# Patient Record
Sex: Female | Born: 1980 | Race: Black or African American | Hispanic: No | Marital: Married | Smoking: Never smoker
Health system: Southern US, Community
[De-identification: ages and names within clinical notes are randomized; demographics above are authoritative.]

## PROBLEM LIST (undated history)

## (undated) DIAGNOSIS — T783XXA Angioneurotic edema, initial encounter: Secondary | ICD-10-CM

## (undated) DIAGNOSIS — L309 Dermatitis, unspecified: Secondary | ICD-10-CM

## (undated) DIAGNOSIS — O09529 Supervision of elderly multigravida, unspecified trimester: Secondary | ICD-10-CM

## (undated) DIAGNOSIS — O09299 Supervision of pregnancy with other poor reproductive or obstetric history, unspecified trimester: Secondary | ICD-10-CM

## (undated) HISTORY — DX: Dermatitis, unspecified: L30.9

## (undated) HISTORY — DX: Angioneurotic edema, initial encounter: T78.3XXA

## (undated) HISTORY — DX: Supervision of elderly multigravida, unspecified trimester: O09.529

## (undated) HISTORY — PX: NO PAST SURGERIES: SHX2092

## (undated) HISTORY — DX: Supervision of pregnancy with other poor reproductive or obstetric history, unspecified trimester: O09.299

---

## 2016-07-15 NOTE — L&D Delivery Note (Signed)
  Patient is 36 y.o. [name] 3567w1d admitted in spontaneous labor, hx of sickle cell trait and prior macrosomic birth.    Delivery Note At 3:38 AM a viable female was delivered via Vaginal, Spontaneous Delivery (Presentation: ROA; vertex ).  APGAR: 8,10; weight pending .   Placenta status: delivered spontaneously with gentle traction, in tact.  Cord: 3vc with the following complications: none.  Cord pH: not sent  Anesthesia:  none Episiotomy: None Lacerations: None Suture Repair: none Est. Blood Loss (mL): 100  Mom to postpartum.  Baby to Couplet care / Skin to Skin.  [name] 03/13/2017, 3:52 AM     Upon arrival patient was complete and pushing. She pushed with good maternal effort to deliver a healthy baby girl. Baby delivered without difficulty, was noted to have good tone and place on maternal abdomen for oral suctioning, drying and stimulation. Delayed cord clamping performed. Placenta delivered intact with 3V cord. Vaginal canal and perineum was inspected and in tact; hemostatic. Pitocin was started and uterus massaged until bleeding slowed. Counts of sharps, instruments, and lap pads were all correct.   Tillman SersAngela C Riccio, DO PGY-2 8/30/20183:54 AM  Patient is a A5W0981G3P2002 at 2367w1d who was admitted in active labor with SROM, significant hx of macrosomic birth (10lbs).  She progressed without augmentation other that AROM of a forebag right before she began pushing.  I was gloved and present for delivery in its entirety.  Second stage of labor progressed, baby delivered after approx 5 contractions.  No decels during second stage noted.  No difficulty with delivery of shoulders.  Complications: none  Lacerations: none  EBL: 100cc  SHAW, KIMBERLY, CNM 4:00 AM 03/13/2017

## 2016-09-17 DIAGNOSIS — Z3A13 13 weeks gestation of pregnancy: Secondary | ICD-10-CM | POA: Diagnosis not present

## 2016-09-17 DIAGNOSIS — Z3482 Encounter for supervision of other normal pregnancy, second trimester: Secondary | ICD-10-CM | POA: Diagnosis not present

## 2016-09-17 LAB — GLUCOSE TOLERANCE, 1 HOUR: Glucose, 1 Hour GTT: 90

## 2016-09-17 LAB — OB RESULTS CONSOLE ANTIBODY SCREEN: Antibody Screen: NEGATIVE

## 2016-09-17 LAB — OB RESULTS CONSOLE GC/CHLAMYDIA
CHLAMYDIA, DNA PROBE: NEGATIVE
Chlamydia: NEGATIVE
Gonorrhea: NEGATIVE
Gonorrhea: NEGATIVE

## 2016-09-17 LAB — OB RESULTS CONSOLE HIV ANTIBODY (ROUTINE TESTING)
HIV: NONREACTIVE
HIV: NONREACTIVE

## 2016-09-17 LAB — OB RESULTS CONSOLE HGB/HCT, BLOOD
HCT: 36
HEMATOCRIT: 36
HEMOGLOBIN: 12.3
Hemoglobin: 12.3

## 2016-09-17 LAB — OB RESULTS CONSOLE HEPATITIS B SURFACE ANTIGEN
HEP B S AG: NEGATIVE
HEP B S AG: NEGATIVE

## 2016-09-17 LAB — OB RESULTS CONSOLE PLATELET COUNT
Platelets: 163
Platelets: 163

## 2016-09-17 LAB — OB RESULTS CONSOLE RUBELLA ANTIBODY, IGM: RUBELLA: IMMUNE

## 2016-09-17 LAB — OB RESULTS CONSOLE ABO/RH: RH TYPE: POSITIVE

## 2016-09-17 LAB — URINE CULTURE
Pap: NEGATIVE
Urine Culture, OB: >100000

## 2016-09-17 LAB — OB RESULTS CONSOLE RPR
RPR: NONREACTIVE
RPR: NONREACTIVE

## 2016-09-17 LAB — SICKLE CELL SCREEN: SICKLE CELL SCREEN: POSITIVE

## 2016-12-17 LAB — OB RESULTS CONSOLE HGB/HCT, BLOOD
HCT: 36
HEMOGLOBIN: 12.1

## 2016-12-17 LAB — URINE CULTURE: URINE CULTURE, OB: NO GROWTH

## 2016-12-17 LAB — OB RESULTS CONSOLE PLATELET COUNT: Platelets: 129

## 2017-01-22 ENCOUNTER — Encounter: Payer: Self-pay | Admitting: *Deleted

## 2017-01-31 ENCOUNTER — Encounter: Payer: Self-pay | Admitting: *Deleted

## 2017-02-06 ENCOUNTER — Encounter: Payer: Self-pay | Admitting: *Deleted

## 2017-02-06 DIAGNOSIS — O2340 Unspecified infection of urinary tract in pregnancy, unspecified trimester: Secondary | ICD-10-CM | POA: Insufficient documentation

## 2017-02-10 ENCOUNTER — Encounter: Payer: Self-pay | Admitting: Obstetrics & Gynecology

## 2017-02-10 ENCOUNTER — Ambulatory Visit (INDEPENDENT_AMBULATORY_CARE_PROVIDER_SITE_OTHER): Payer: Medicaid Other | Admitting: Obstetrics & Gynecology

## 2017-02-10 DIAGNOSIS — Z3483 Encounter for supervision of other normal pregnancy, third trimester: Secondary | ICD-10-CM

## 2017-02-10 DIAGNOSIS — D573 Sickle-cell trait: Secondary | ICD-10-CM

## 2017-02-10 DIAGNOSIS — O09293 Supervision of pregnancy with other poor reproductive or obstetric history, third trimester: Secondary | ICD-10-CM

## 2017-02-10 DIAGNOSIS — O09299 Supervision of pregnancy with other poor reproductive or obstetric history, unspecified trimester: Secondary | ICD-10-CM

## 2017-02-10 DIAGNOSIS — Z3480 Encounter for supervision of other normal pregnancy, unspecified trimester: Secondary | ICD-10-CM | POA: Insufficient documentation

## 2017-02-10 DIAGNOSIS — Z87898 Personal history of other specified conditions: Secondary | ICD-10-CM

## 2017-02-10 HISTORY — DX: Supervision of pregnancy with other poor reproductive or obstetric history, unspecified trimester: O09.299

## 2017-02-10 LAB — POCT URINALYSIS DIP (DEVICE)
Bilirubin Urine: NEGATIVE
Glucose, UA: NEGATIVE mg/dL
Hgb urine dipstick: NEGATIVE
KETONES UR: NEGATIVE mg/dL
Leukocytes, UA: NEGATIVE
Nitrite: NEGATIVE
PROTEIN: NEGATIVE mg/dL
Specific Gravity, Urine: 1.015 (ref 1.005–1.030)
Urobilinogen, UA: 0.2 mg/dL (ref 0.0–1.0)
pH: 5.5 (ref 5.0–8.0)

## 2017-02-10 NOTE — Progress Notes (Signed)
   PRENATAL VISIT NOTE  Subjective:  Glenna DurandFatima Schwarzkopf is a 36 y.o. U9W1191G4P2012 at 3542w5d being seen today for ongoing prenatal care.  She is currently monitored for the following issues for this high-risk pregnancy and has UTI in pregnancy, antepartum; Supervision of other normal pregnancy, antepartum; Sickle cell trait (HCC); History of anesthesia complications; and History of macrosomia in infant in prior pregnancy, currently pregnant on her problem list.  Patient reports no complaints.  Contractions: Irregular. Vag. Bleeding: None.  Movement: Present. Denies leaking of fluid.   The following portions of the patient's history were reviewed and updated as appropriate: allergies, current medications, past family history, past medical history, past social history, past surgical history and problem list. Problem list updated.  Objective:   Vitals:   02/10/17 0815  BP: 118/68  Pulse: 87  Weight: 162 lb 12.8 oz (73.8 kg)    Fetal Status: Fetal Heart Rate (bpm): 135   Movement: Present     General:  Alert, oriented and cooperative. Patient is in no acute distress.  Skin: Skin is warm and dry. No rash noted.   Cardiovascular: Normal heart rate noted  Respiratory: Normal respiratory effort, no problems with respiration noted  Abdomen: Soft, gravid, appropriate for gestational age.  Pain/Pressure: Present     Pelvic: Cervical exam deferred        Extremities: Normal range of motion.  Edema: None  Mental Status:  Normal mood and affect. Normal behavior. Normal judgment and thought content.   Assessment and Plan:  Pregnancy: Y7W2956G4P2012 at 6742w5d  1. Supervision of other normal pregnancy, antepartum - Culture, OB Urine - [facility] and need glucola results (was told it was nml).  2. Sickle cell trait (HCC) - Culture, OB Urine  3. History of anesthesia complications Spianl HA after epidural.  Planning natural childbirth.  4. History of macrosomia in infant in prior pregnancy,  currently pregnant 10lb [place].  No shoulder dystocia per patient.  Term labor symptoms and general obstetric precautions including but not limited to vaginal bleeding, contractions, leaking of fluid and fetal movement were reviewed in detail with the patient. Please refer to After Visit Summary for other counseling recommendations.  Return in about 1 week (around 02/17/2017).   Elsie LincolnKelly Reyes Fifield, MD

## 2017-02-12 LAB — URINE CULTURE, OB REFLEX

## 2017-02-12 LAB — CULTURE, OB URINE

## 2017-02-17 ENCOUNTER — Other Ambulatory Visit (HOSPITAL_COMMUNITY)
Admission: RE | Admit: 2017-02-17 | Discharge: 2017-02-17 | Disposition: A | Discharge status: Home / Self Care | Payer: Medicaid Other | Source: Ambulatory Visit | Attending: Student | Admitting: Student

## 2017-02-17 ENCOUNTER — Ambulatory Visit (INDEPENDENT_AMBULATORY_CARE_PROVIDER_SITE_OTHER): Payer: Medicaid Other | Admitting: Student

## 2017-02-17 VITALS — BP 110/65 | HR 75 | Wt 165.8 lb

## 2017-02-17 DIAGNOSIS — Z3480 Encounter for supervision of other normal pregnancy, unspecified trimester: Secondary | ICD-10-CM | POA: Diagnosis not present

## 2017-02-17 DIAGNOSIS — IMO0002 Reserved for concepts with insufficient information to code with codable children: Secondary | ICD-10-CM

## 2017-02-17 DIAGNOSIS — O2340 Unspecified infection of urinary tract in pregnancy, unspecified trimester: Secondary | ICD-10-CM

## 2017-02-17 DIAGNOSIS — O2343 Unspecified infection of urinary tract in pregnancy, third trimester: Secondary | ICD-10-CM

## 2017-02-17 DIAGNOSIS — Z0489 Encounter for examination and observation for other specified reasons: Secondary | ICD-10-CM

## 2017-02-17 DIAGNOSIS — Z3483 Encounter for supervision of other normal pregnancy, third trimester: Secondary | ICD-10-CM

## 2017-02-17 DIAGNOSIS — Z048 Encounter for examination and observation for other specified reasons: Secondary | ICD-10-CM

## 2017-02-17 NOTE — Progress Notes (Signed)
OB US scheduled for August 13th @ 1345.  Pt notified.

## 2017-02-17 NOTE — Patient Instructions (Signed)

## 2017-02-18 NOTE — Progress Notes (Signed)
   PRENATAL VISIT NOTE  Subjective:  Melanie Peterson is a 36 y.o. [name] at 949w6d being seen today for ongoing prenatal care.  She is currently monitored for the following issues for this low-risk pregnancy and has UTI in pregnancy, antepartum; Supervision of other normal pregnancy, antepartum; Sickle cell trait (HCC); History of anesthesia complications; and History of macrosomia in infant in prior pregnancy, currently pregnant on her problem list.  Patient reports no complaints.  Contractions: Not present. Vag. Bleeding: None.  Movement: Present. Denies leaking of fluid.   The following portions of the patient's history were reviewed and updated as appropriate: allergies, current medications, past family history, past medical history, past social history, past surgical history and problem list. Problem list updated.  Objective:   Vitals:   02/17/17 1124  BP: 110/65  Pulse: 75  Weight: 165 lb 12.8 oz (75.2 kg)    Fetal Status: Fetal Heart Rate (bpm): 140 Fundal Height: 36 cm Movement: Present     General:  Alert, oriented and cooperative. Patient is in no acute distress.  Skin: Skin is warm and dry. No rash noted.   Cardiovascular: Normal heart rate noted  Respiratory: Normal respiratory effort, no problems with respiration noted  Abdomen: Soft, gravid, appropriate for gestational age.  Pain/Pressure: Present     Pelvic: Cervical exam deferred        Extremities: Normal range of motion.  Edema: None  Mental Status:  Normal mood and affect. Normal behavior. Normal judgment and thought content.   Assessment and Plan:  Pregnancy: A5W0981G4P2012 at 6049w6d  1. Supervision of other normal pregnancy, antepartum  - Culture, beta strep (group b only) - US MFM OB COMP + 14 WK; Future - GC/Chlamydia probe amp ([place])[facility]  2. Evaluate anatomy not seen on prior sonogram  - US MFM OB COMP + 14 WK; Future - GC/Chlamydia probe amp ([place])[facility]  3. UTI in pregnancy,  antepartum Patient denies any s/s of UTI at this time.   Term labor symptoms and general obstetric precautions including but not limited to vaginal bleeding, contractions, leaking of fluid and fetal movement were reviewed in detail with the patient. Please refer to After Visit Summary for other counseling recommendations.  Return in about 1 week (around 02/24/2017).   Marylene LandKathryn Lorraine Chailyn Racette, CNM

## 2017-02-19 LAB — GC/CHLAMYDIA PROBE AMP (~~LOC~~) NOT AT ARMC
Chlamydia: NEGATIVE
Neisseria Gonorrhea: NEGATIVE

## 2017-02-21 LAB — CULTURE, BETA STREP (GROUP B ONLY): Strep Gp B Culture: NEGATIVE

## 2017-02-24 ENCOUNTER — Other Ambulatory Visit: Payer: Self-pay | Admitting: Student

## 2017-02-24 ENCOUNTER — Ambulatory Visit (INDEPENDENT_AMBULATORY_CARE_PROVIDER_SITE_OTHER): Payer: Medicaid Other | Admitting: Advanced Practice Midwife

## 2017-02-24 ENCOUNTER — Ambulatory Visit (HOSPITAL_COMMUNITY)
Admission: RE | Admit: 2017-02-24 | Discharge: 2017-02-24 | Disposition: A | Discharge status: Home / Self Care | Payer: Medicaid Other | Source: Ambulatory Visit | Attending: Student | Admitting: Student

## 2017-02-24 VITALS — BP 112/66 | HR 89 | Wt 166.0 lb

## 2017-02-24 DIAGNOSIS — Z3480 Encounter for supervision of other normal pregnancy, unspecified trimester: Secondary | ICD-10-CM

## 2017-02-24 DIAGNOSIS — O09293 Supervision of pregnancy with other poor reproductive or obstetric history, third trimester: Secondary | ICD-10-CM | POA: Diagnosis not present

## 2017-02-24 DIAGNOSIS — Z3A36 36 weeks gestation of pregnancy: Secondary | ICD-10-CM

## 2017-02-24 DIAGNOSIS — Z3689 Encounter for other specified antenatal screening: Secondary | ICD-10-CM | POA: Diagnosis not present

## 2017-02-24 DIAGNOSIS — IMO0002 Reserved for concepts with insufficient information to code with codable children: Secondary | ICD-10-CM

## 2017-02-24 DIAGNOSIS — Z3483 Encounter for supervision of other normal pregnancy, third trimester: Secondary | ICD-10-CM

## 2017-02-24 DIAGNOSIS — O352XX0 Maternal care for (suspected) hereditary disease in fetus, not applicable or unspecified: Secondary | ICD-10-CM | POA: Insufficient documentation

## 2017-02-24 DIAGNOSIS — Z048 Encounter for examination and observation for other specified reasons: Secondary | ICD-10-CM | POA: Diagnosis present

## 2017-02-24 DIAGNOSIS — Z862 Personal history of diseases of the blood and blood-forming organs and certain disorders involving the immune mechanism: Secondary | ICD-10-CM

## 2017-02-24 DIAGNOSIS — Z0489 Encounter for examination and observation for other specified reasons: Secondary | ICD-10-CM

## 2017-02-24 NOTE — Progress Notes (Signed)
   PRENATAL VISIT NOTE  Subjective:  Melanie Peterson is a 36 y.o. [id number] at 4053w5d being seen today for ongoing prenatal care.  She is currently monitored for the following issues for this low-risk pregnancy and has UTI in pregnancy, antepartum; Supervision of other normal pregnancy, antepartum; Sickle cell trait (HCC); History of anesthesia complications; and History of macrosomia in infant in prior pregnancy, currently pregnant on her problem list.  Patient reports occasional contractions.  Contractions: Irregular. Vag. Bleeding: None.  Movement: Present. Denies leaking of fluid. States this baby feels smaller than her 10 lb baby.   The following portions of the patient's history were reviewed and updated as appropriate: allergies, current medications, past family history, past medical history, past social history, past surgical history and problem list. Problem list updated.  Objective:   Vitals:   02/24/17 1057  BP: 112/66  Pulse: 89  Weight: 166 lb (75.3 kg)    Fetal Status: Fetal Heart Rate (bpm): 150 Fundal Height: 38 cm Movement: Present  Presentation: Vertex  General:  Alert, oriented and cooperative. Patient is in no acute distress.  Skin: Skin is warm and dry. No rash noted.   Cardiovascular: Normal heart rate noted  Respiratory: Normal respiratory effort, no problems with respiration noted  Abdomen: Soft, gravid, appropriate for gestational age.  Pain/Pressure: Present     Pelvic: Cervical exam performed Dilation: 3 Effacement (%): 0 Station: -3  Extremities: Normal range of motion.  Edema: None  Mental Status:  Normal mood and affect. Normal behavior. Normal judgment and thought content.   GBS neg  Assessment and Plan:  Pregnancy: J4N8295G4P2012 at 6453w5d  1. Supervision of other normal pregnancy, antepartum   Term labor symptoms and general obstetric precautions including but not limited to vaginal bleeding, contractions, leaking of fluid and fetal movement were reviewed in  detail with the patient. Please refer to After Visit Summary for other counseling recommendations.  Return in about 1 week (around 03/03/2017) for ROB.   Dorathy KinsmanVirginia Zalyn Amend, CNM

## 2017-02-24 NOTE — Patient Instructions (Signed)
AREA PEDIATRIC/FAMILY PRACTICE PHYSICIANS  [facility] [address], [address] [place], [place]  [address] Phone - [phone]   Fax - [phone]  [facility] [address] [place], [place] [address] Phone - [phone]   Fax - [phone]  [facility] [address] [place], [place]  [address] Phone - [phone]   Fax - [phone]  [facility] [address] [place], [place]  [address] Phone - [phone]   Fax - [phone]  [facility] [address] [place], [place]  [address] Phone - [phone]   Fax - [phone]  [facility] [address] [place], [place]  [address] Phone - [phone]   Fax - [phone]  [facility] [place] [facility] [address] [place], [place]  [address] Phone - [phone]   Fax - [phone]  [facility] [address] [place], [place]  [address] Phone - [phone]   Fax - [phone]  [facility] [address] [place], [place]  [address] Phone - [phone]   Fax - [phone] [facility] [address] [place], [place]  [address] Phone - [phone]   Fax - [phone]  [facility] [address] [place], [place]  [address] Phone - [phone]   Fax - [phone]  [facility] [address] [place], [place]  [address] Phone - [phone]   Fax - [phone]  [facility] [address] [place], [place]  [address] Phone - [phone]   Fax - [phone]  [facility] [address] [place], [place]  [address] Phone - [phone]  [facility] [address] [place], [place]  [address] Phone - [phone]   Fax - [phone]  [facility] [address] [place], [place]  [address] Phone - [phone]   Fax - [phone]  [facility] [address] [place], [place]  [address] Phone - [phone]   Fax - [phone]  [facility] [address] [place], [place]  [address] Phone - [phone]   Fax - [phone]   [facility] [address] [place], [place]  [address] Phone - [phone]   Fax - [phone]  [facility] [address] [place], [place]  [address] Phone - [phone]   Fax - [phone]  [facility] [address] [place], [place]  [address] Phone - [phone]   Fax - [phone]  [facility] [address] , [place]  [address] Phone - [phone]   Fax - [phone]  [facility] [address] , [place]  [address] Phone - [phone]   Fax - [phone] [facility] [address] [place], [place]  [address] Phone - [phone]   Fax - [phone]  [facility] [address] [place], [place]  [address] Phone - [phone]   Fax - [phone]   [facility] [address] [place], [place]  [address] Phone - [phone]   Fax - [phone]  [facility] Charlene Flemming MD [address] [place] [address] Phone [phone]  Fax [phone]   Braxton Hicks Contractions Contractions of the uterus can occur throughout pregnancy, but they are   not always a sign that you are in labor. You may have practice contractions called Braxton Hicks contractions. These false labor contractions are sometimes confused with true labor. What are Braxton Hicks contractions? Braxton Hicks contractions are tightening movements that occur in the muscles of the uterus before labor. Unlike true labor contractions, these contractions do not result in  opening (dilation) and thinning of the cervix. Toward the end of pregnancy (32-34 weeks), Braxton Hicks contractions can happen more often and may become stronger. These contractions are sometimes difficult to tell apart from true labor because they can be very uncomfortable. You should not feel embarrassed if you go to the hospital with false labor. Sometimes, the only way to tell if you are in true labor is for your health care provider to look for changes in the cervix. The health care provider will do a physical exam and may monitor your contractions. If you are not in true labor, the exam should show that your cervix is not dilating and your water has not broken. If there are no prenatal problems or other health problems associated with your pregnancy, it is completely safe for you to be sent home with false labor. You may continue to have Braxton Hicks contractions until you go into true labor. How can I tell the difference between true labor and false labor?  Differences ? False labor ? Contractions last 30-70 seconds.: Contractions are usually shorter and not as strong as true labor contractions. ? Contractions become very regular.: Contractions are usually irregular. ? Discomfort is usually felt in the top of the uterus, and it spreads to the lower abdomen and low back.: Contractions are often felt in the front of the lower abdomen and in the groin. ? Contractions do not go away with walking.: Contractions may go away when you walk around or change positions while lying down. ? Contractions usually become more intense and increase in frequency.: Contractions get weaker and are shorter-lasting as time goes on. ? The cervix dilates and gets thinner.: The cervix usually does not dilate or become thin. Follow these instructions at home:  Take over-the-counter and prescription medicines only as told by your health care provider.  Keep up with your usual exercises and follow other instructions  from your health care provider.  Eat and drink lightly if you think you are going into labor.  If Braxton Hicks contractions are making you uncomfortable: ? Change your position from lying down or resting to walking, or change from walking to resting. ? Sit and rest in a tub of warm water. ? Drink enough fluid to keep your urine clear or pale yellow. Dehydration may cause these contractions. ? Do slow and deep breathing several times an hour.  Keep all follow-up prenatal visits as told by your health care provider. This is important. Contact a health care provider if:  You have a fever.  You have continuous pain in your abdomen. Get help right away if:  Your contractions become stronger, more regular, and closer together.  You have fluid leaking or gushing from your vagina.  You pass blood-tinged mucus (bloody show).  You have bleeding from your vagina.  You have low back pain that you never had before.  You feel your baby's head pushing down and causing pelvic pressure.  Your baby is not moving inside you as much as it used to. Summary  Contractions that occur before labor are called Braxton Hicks contractions, false labor, or practice contractions.  Braxton Hicks contractions   are usually shorter, weaker, farther apart, and less regular than true labor contractions. True labor contractions usually become progressively stronger and regular and they become more frequent.  Manage discomfort from Braxton Hicks contractions by changing position, resting in a warm bath, drinking plenty of water, or practicing deep breathing. This information is not intended to replace advice given to you by your health care provider. Make sure you discuss any questions you have with your health care provider. Document Released: 07/01/2005 Document Revised: 05/20/2016 Document Reviewed: 05/20/2016 Elsevier Interactive Patient Education  2017 Elsevier Inc.  

## 2017-03-03 ENCOUNTER — Encounter: Payer: Self-pay | Admitting: Family Medicine

## 2017-03-03 ENCOUNTER — Ambulatory Visit (INDEPENDENT_AMBULATORY_CARE_PROVIDER_SITE_OTHER): Payer: Medicaid Other | Admitting: Family Medicine

## 2017-03-03 VITALS — BP 117/61 | Wt 167.4 lb

## 2017-03-03 DIAGNOSIS — O09299 Supervision of pregnancy with other poor reproductive or obstetric history, unspecified trimester: Secondary | ICD-10-CM

## 2017-03-03 DIAGNOSIS — Z3483 Encounter for supervision of other normal pregnancy, third trimester: Secondary | ICD-10-CM

## 2017-03-03 DIAGNOSIS — Z3480 Encounter for supervision of other normal pregnancy, unspecified trimester: Secondary | ICD-10-CM

## 2017-03-03 DIAGNOSIS — O09293 Supervision of pregnancy with other poor reproductive or obstetric history, third trimester: Secondary | ICD-10-CM

## 2017-03-03 NOTE — Progress Notes (Signed)
   PRENATAL VISIT NOTE  Subjective:  Melanie Peterson is a 36 y.o. H4V4259 at [redacted]w[redacted]d being seen today for ongoing prenatal care.  She is currently monitored for the following issues for this low-risk pregnancy and has UTI in pregnancy, antepartum; Supervision of other normal pregnancy, antepartum; Sickle cell trait (HCC); History of anesthesia complications; and History of macrosomia in infant in prior pregnancy, currently pregnant on her problem list.  Patient reports no complaints.  Contractions: Not present. Vag. Bleeding: None.  Movement: Present. Denies leaking of fluid.   The following portions of the patient's history were reviewed and updated as appropriate: allergies, current medications, past family history, past medical history, past social history, past surgical history and problem list. Problem list updated.  Objective:   Vitals:   03/03/17 1037  BP: 117/61  Weight: 167 lb 6.4 oz (75.9 kg)    Fetal Status: Fetal Heart Rate (bpm): 145   Movement: Present  Presentation: Vertex  General:  Alert, oriented and cooperative. Patient is in no acute distress.  Skin: Skin is warm and dry. No rash noted.   Cardiovascular: Normal heart rate noted  Respiratory: Normal respiratory effort, no problems with respiration noted  Abdomen: Soft, gravid, appropriate for gestational age.  Pain/Pressure: Present     Pelvic: Cervical exam performed Dilation: 3      Extremities: Normal range of motion.  Edema: None  Mental Status:  Normal mood and affect. Normal behavior. Normal judgment and thought content.   Assessment and Plan:  Pregnancy: [id number] at [redacted]w[redacted]d  1. Supervision of other normal pregnancy, antepartum - Routine PNC - Follow up in 1 week  2. History of macrosomia in infant in prior pregnancy, currently pregnant Per history, 10-lb baby in 2012. No shoulder dystocia - EFW 3393g/7lb8oz, 89%ile on 02/24/17 at [redacted]w[redacted]d  Term labor symptoms and general obstetric precautions including but not  limited to vaginal bleeding, contractions, leaking of fluid and fetal movement were reviewed in detail with the patient. Please refer to After Visit Summary for other counseling recommendations.  Return in about 1 week (around 03/10/2017).   Frederik Pear, MD   Future Appointments Date Time Provider Department Center  03/10/2017 10:40 AM Kathlene Cote [facility]  03/18/2017 7:40 AM Dorathy Kinsman, CNM [facility]

## 2017-03-10 ENCOUNTER — Telehealth (HOSPITAL_COMMUNITY): Payer: Self-pay | Admitting: *Deleted

## 2017-03-10 ENCOUNTER — Other Ambulatory Visit: Payer: Self-pay | Admitting: Family Medicine

## 2017-03-10 ENCOUNTER — Encounter: Payer: Self-pay | Admitting: Medical

## 2017-03-10 ENCOUNTER — Ambulatory Visit (INDEPENDENT_AMBULATORY_CARE_PROVIDER_SITE_OTHER): Payer: Medicaid Other | Admitting: Medical

## 2017-03-10 VITALS — BP 113/69 | HR 78 | Wt 167.8 lb

## 2017-03-10 DIAGNOSIS — Z3480 Encounter for supervision of other normal pregnancy, unspecified trimester: Secondary | ICD-10-CM

## 2017-03-10 NOTE — Progress Notes (Signed)
   PRENATAL VISIT NOTE  Subjective:  Melanie Peterson is a 36 y.o. O7P0340 at [redacted]w[redacted]d being seen today for ongoing prenatal care.  She is currently monitored for the following issues for this high-risk pregnancy and has UTI in pregnancy, antepartum; Supervision of other normal pregnancy, antepartum; Sickle cell trait (HCC); History of anesthesia complications; and History of macrosomia in infant in prior pregnancy, currently pregnant on her problem list.  Patient reports occasional contractions.  Contractions: Irregular. Vag. Bleeding: None.  Movement: Present. Denies leaking of fluid.   The following portions of the patient's history were reviewed and updated as appropriate: allergies, current medications, past family history, past medical history, past social history, past surgical history and problem list. Problem list updated.  Objective:   Vitals:   03/10/17 1023  BP: 113/69  Pulse: 78  Weight: 167 lb 12.8 oz (76.1 kg)    Fetal Status: Fetal Heart Rate (bpm): 141 Fundal Height: 39 cm Movement: Present     General:  Alert, oriented and cooperative. Patient is in no acute distress.  Skin: Skin is warm and dry. No rash noted.   Cardiovascular: Normal heart rate noted  Respiratory: Normal respiratory effort, no problems with respiration noted  Abdomen: Soft, gravid, appropriate for gestational age.  Pain/Pressure: Absent     Pelvic: Cervical exam deferred        Extremities: Normal range of motion.  Edema: None  Mental Status:  Normal mood and affect. Normal behavior. Normal judgment and thought content.   Assessment and Plan:  Pregnancy: [id number] at [redacted]w[redacted]d  1. Supervision of other normal pregnancy, antepartum - History of macrosomia, patient EFW 89%til at 36 weeks - Discussed option to induce labor at 39 weeks, patient and husband would prefer to wait until due date - IOL scheduled for 03/19/17  Term labor symptoms and general obstetric precautions including but not limited to vaginal  bleeding, contractions, leaking of fluid and fetal movement were reviewed in detail with the patient. Please refer to After Visit Summary for other counseling recommendations.  Return in about 1 week (around 03/17/2017) for LOB.   Vonzella Nipple, PA-C

## 2017-03-10 NOTE — Progress Notes (Signed)
Patient here today for Routine OB visit [redacted]w[redacted]d.

## 2017-03-10 NOTE — Patient Instructions (Signed)
Fetal Movement Counts °Patient Name: ________________________________________________ Patient Due Date: ____________________ °What is a fetal movement count? °A fetal movement count is the number of times that you feel your baby move during a certain amount of time. This may also be called a fetal kick count. A fetal movement count is recommended for every pregnant woman. You may be asked to start counting fetal movements as early as week 28 of your pregnancy. °Pay attention to when your baby is most active. You may notice your baby's sleep and wake cycles. You may also notice things that make your baby move more. You should do a fetal movement count: °· When your baby is normally most active. °· At the same time each day. ° °A good time to count movements is while you are resting, after having something to eat and drink. °How do I count fetal movements? °1. Find a quiet, comfortable area. Sit, or lie down on your side. °2. Write down the date, the start time and stop time, and the number of movements that you felt between those two times. Take this information with you to your health care visits. °3. For 2 hours, count kicks, flutters, swishes, rolls, and jabs. You should feel at least 10 movements during 2 hours. °4. You may stop counting after you have felt 10 movements. °5. If you do not feel 10 movements in 2 hours, have something to eat and drink. Then, keep resting and counting for 1 hour. If you feel at least 4 movements during that hour, you may stop counting. °Contact a health care provider if: °· You feel fewer than 4 movements in 2 hours. °· Your baby is not moving like he or she usually does. °Date: ____________ Start time: ____________ Stop time: ____________ Movements: ____________ °Date: ____________ Start time: ____________ Stop time: ____________ Movements: ____________ °Date: ____________ Start time: ____________ Stop time: ____________ Movements: ____________ °Date: ____________ Start time:  ____________ Stop time: ____________ Movements: ____________ °Date: ____________ Start time: ____________ Stop time: ____________ Movements: ____________ °Date: ____________ Start time: ____________ Stop time: ____________ Movements: ____________ °Date: ____________ Start time: ____________ Stop time: ____________ Movements: ____________ °Date: ____________ Start time: ____________ Stop time: ____________ Movements: ____________ °Date: ____________ Start time: ____________ Stop time: ____________ Movements: ____________ °This information is not intended to replace advice given to you by your health care provider. Make sure you discuss any questions you have with your health care provider. °Document Released: 07/31/2006 Document Revised: 02/28/2016 Document Reviewed: 08/10/2015 °Elsevier Interactive Patient Education © 2018 [employer] °Braxton Hicks Contractions °Contractions of the uterus can occur throughout pregnancy, but they are not always a sign that you are in labor. You may have practice contractions called Braxton Hicks contractions. These false labor contractions are sometimes confused with true labor. °What are Braxton Hicks contractions? °Braxton Hicks contractions are tightening movements that occur in the muscles of the uterus before labor. Unlike true labor contractions, these contractions do not result in opening (dilation) and thinning of the cervix. Toward the end of pregnancy (32-34 weeks), Braxton Hicks contractions can happen more often and may become stronger. These contractions are sometimes difficult to tell apart from true labor because they can be very uncomfortable. You should not feel embarrassed if you go to the hospital with false labor. °Sometimes, the only way to tell if you are in true labor is for your health care provider to look for changes in the cervix. The health care provider will do a physical exam and may monitor your contractions. If   you are not in true labor, the exam  should show that your cervix is not dilating and your water has not broken. °If there are no prenatal problems or other health problems associated with your pregnancy, it is completely safe for you to be sent home with false labor. You may continue to have Braxton Hicks contractions until you go into true labor. °How can I tell the difference between true labor and false labor? °· Differences °? False labor °? Contractions last 30-70 seconds.: Contractions are usually shorter and not as strong as true labor contractions. °? Contractions become very regular.: Contractions are usually irregular. °? Discomfort is usually felt in the top of the uterus, and it spreads to the lower abdomen and low back.: Contractions are often felt in the front of the lower abdomen and in the groin. °? Contractions do not go away with walking.: Contractions may go away when you walk around or change positions while lying down. °? Contractions usually become more intense and increase in frequency.: Contractions get weaker and are shorter-lasting as time goes on. °? The cervix dilates and gets thinner.: The cervix usually does not dilate or become thin. °Follow these instructions at home: °· Take over-the-counter and prescription medicines only as told by your health care provider. °· Keep up with your usual exercises and follow other instructions from your health care provider. °· Eat and drink lightly if you think you are going into labor. °· If Braxton Hicks contractions are making you uncomfortable: °? Change your position from lying down or resting to walking, or change from walking to resting. °? Sit and rest in a tub of warm water. °? Drink enough fluid to keep your urine clear or pale yellow. Dehydration may cause these contractions. °? Do slow and deep breathing several times an hour. °· Keep all follow-up prenatal visits as told by your health care provider. This is important. °Contact a health care provider if: °· You have a  fever. °· You have continuous pain in your abdomen. °Get help right away if: °· Your contractions become stronger, more regular, and closer together. °· You have fluid leaking or gushing from your vagina. °· You pass blood-tinged mucus (bloody show). °· You have bleeding from your vagina. °· You have low back pain that you never had before. °· You feel your baby’s head pushing down and causing pelvic pressure. °· Your baby is not moving inside you as much as it used to. °Summary °· Contractions that occur before labor are called Braxton Hicks contractions, false labor, or practice contractions. °· Braxton Hicks contractions are usually shorter, weaker, farther apart, and less regular than true labor contractions. True labor contractions usually become progressively stronger and regular and they become more frequent. °· Manage discomfort from Braxton Hicks contractions by changing position, resting in a warm bath, drinking plenty of water, or practicing deep breathing. °This information is not intended to replace advice given to you by your health care provider. Make sure you discuss any questions you have with your health care provider. °Document Released: 07/01/2005 Document Revised: 05/20/2016 Document Reviewed: 05/20/2016 °Elsevier Interactive Patient Education © 2017 Else[employer] Inc. ° °

## 2017-03-10 NOTE — Telephone Encounter (Signed)
Preadmission screen  

## 2017-03-12 ENCOUNTER — Inpatient Hospital Stay (HOSPITAL_COMMUNITY)
Admission: AD | Admit: 2017-03-12 | Discharge: 2017-03-15 | DRG: 775 | Disposition: A | Discharge status: Home / Self Care | Payer: Medicaid Other | Source: Ambulatory Visit | Attending: Obstetrics and Gynecology | Admitting: Obstetrics and Gynecology

## 2017-03-12 ENCOUNTER — Encounter (HOSPITAL_COMMUNITY): Payer: Self-pay | Admitting: *Deleted

## 2017-03-12 DIAGNOSIS — D573 Sickle-cell trait: Secondary | ICD-10-CM | POA: Diagnosis present

## 2017-03-12 DIAGNOSIS — Z3A39 39 weeks gestation of pregnancy: Secondary | ICD-10-CM

## 2017-03-12 DIAGNOSIS — O9902 Anemia complicating childbirth: Principal | ICD-10-CM | POA: Diagnosis present

## 2017-03-12 LAB — OB RESULTS CONSOLE GBS: STREP GROUP B AG: NEGATIVE

## 2017-03-12 NOTE — MAU Note (Signed)
Pt presents to MAU with contractions that started 30 mins prior to arrival that are every 10 mins apart. Pt denies LOF or vaginal bleeding. Reports good fetal movement. States she was 3.5cm last week in the office.

## 2017-03-13 ENCOUNTER — Encounter (HOSPITAL_COMMUNITY): Payer: Self-pay

## 2017-03-13 DIAGNOSIS — Z3A39 39 weeks gestation of pregnancy: Secondary | ICD-10-CM

## 2017-03-13 DIAGNOSIS — O9902 Anemia complicating childbirth: Secondary | ICD-10-CM | POA: Diagnosis present

## 2017-03-13 DIAGNOSIS — Z3493 Encounter for supervision of normal pregnancy, unspecified, third trimester: Secondary | ICD-10-CM | POA: Diagnosis present

## 2017-03-13 DIAGNOSIS — D573 Sickle-cell trait: Secondary | ICD-10-CM | POA: Diagnosis present

## 2017-03-13 LAB — POCT FERN TEST: POCT Fern Test: POSITIVE

## 2017-03-13 LAB — CBC
HEMATOCRIT: 34.8 % — AB (ref 36.0–46.0)
HEMOGLOBIN: 11.7 g/dL — AB (ref 12.0–15.0)
MCH: 26.1 pg (ref 26.0–34.0)
MCHC: 33.6 g/dL (ref 30.0–36.0)
MCV: 77.5 fL — ABNORMAL LOW (ref 78.0–100.0)
Platelets: 132 10*3/uL — ABNORMAL LOW (ref 150–400)
RBC: 4.49 MIL/uL (ref 3.87–5.11)
RDW: 14.4 % (ref 11.5–15.5)
WBC: 7.3 10*3/uL (ref 4.0–10.5)

## 2017-03-13 LAB — TYPE AND SCREEN
ABO/RH(D): O POS
ANTIBODY SCREEN: NEGATIVE

## 2017-03-13 LAB — ABO/RH: ABO/RH(D): O POS

## 2017-03-13 LAB — RPR: RPR: NONREACTIVE

## 2017-03-13 MED ORDER — LACTATED RINGERS IV SOLN: 500.0000 mL | INTRAVENOUS | Status: DC | PRN

## 2017-03-13 MED ORDER — SENNOSIDES-DOCUSATE SODIUM 8.6-50 MG PO TABS
2.0000 | ORAL_TABLET | ORAL | Status: DC
  Administered 2017-03-14 (×2): 2 via ORAL
  Filled 2017-03-13 (×2): qty 2

## 2017-03-13 MED ORDER — SIMETHICONE 80 MG PO CHEW: 80.0000 mg | CHEWABLE_TABLET | ORAL | Status: DC | PRN

## 2017-03-13 MED ORDER — COCONUT OIL OIL
1.0000 "application " | TOPICAL_OIL | Status: DC | PRN
  Administered 2017-03-14: 1 via TOPICAL
  Filled 2017-03-13: qty 120

## 2017-03-13 MED ORDER — OXYCODONE-ACETAMINOPHEN 5-325 MG PO TABS: 1.0000 | ORAL_TABLET | ORAL | Status: DC | PRN

## 2017-03-13 MED ORDER — PHENYLEPHRINE 40 MCG/ML (10ML) SYRINGE FOR IV PUSH (FOR BLOOD PRESSURE SUPPORT)
80.0000 ug | PREFILLED_SYRINGE | INTRAVENOUS | Status: DC | PRN
  Filled 2017-03-13: qty 5

## 2017-03-13 MED ORDER — OXYTOCIN BOLUS FROM INFUSION
500.0000 mL | Freq: Once | INTRAVENOUS | Status: AC
  Administered 2017-03-13: 500 mL via INTRAVENOUS

## 2017-03-13 MED ORDER — OXYCODONE-ACETAMINOPHEN 5-325 MG PO TABS: 2.0000 | ORAL_TABLET | ORAL | Status: DC | PRN

## 2017-03-13 MED ORDER — DIPHENHYDRAMINE HCL 25 MG PO CAPS: 25.0000 mg | ORAL_CAPSULE | Freq: Four times a day (QID) | ORAL | Status: DC | PRN

## 2017-03-13 MED ORDER — ZOLPIDEM TARTRATE 5 MG PO TABS: 5.0000 mg | ORAL_TABLET | Freq: Every evening | ORAL | Status: DC | PRN

## 2017-03-13 MED ORDER — LIDOCAINE HCL (PF) 1 % IJ SOLN
INTRAMUSCULAR | Status: AC
  Filled 2017-03-13: qty 30

## 2017-03-13 MED ORDER — BENZOCAINE-MENTHOL 20-0.5 % EX AERO
1.0000 "application " | INHALATION_SPRAY | CUTANEOUS | Status: DC | PRN
  Administered 2017-03-13: 1 via TOPICAL
  Filled 2017-03-13: qty 56

## 2017-03-13 MED ORDER — WITCH HAZEL-GLYCERIN EX PADS: 1.0000 "application " | MEDICATED_PAD | CUTANEOUS | Status: DC | PRN

## 2017-03-13 MED ORDER — ACETAMINOPHEN 325 MG PO TABS
650.0000 mg | ORAL_TABLET | ORAL | Status: DC | PRN
  Filled 2017-03-13: qty 2

## 2017-03-13 MED ORDER — LIDOCAINE HCL (PF) 1 % IJ SOLN
30.0000 mL | INTRAMUSCULAR | Status: DC | PRN
  Filled 2017-03-13: qty 30

## 2017-03-13 MED ORDER — EPHEDRINE 5 MG/ML INJ
10.0000 mg | INTRAVENOUS | Status: DC | PRN
  Filled 2017-03-13: qty 2

## 2017-03-13 MED ORDER — FENTANYL CITRATE (PF) 100 MCG/2ML IJ SOLN
INTRAMUSCULAR | Status: AC
  Filled 2017-03-13: qty 2

## 2017-03-13 MED ORDER — TETANUS-DIPHTH-ACELL PERTUSSIS 5-2.5-18.5 LF-MCG/0.5 IM SUSP: 0.5000 mL | Freq: Once | INTRAMUSCULAR | Status: DC

## 2017-03-13 MED ORDER — LACTATED RINGERS IV SOLN
INTRAVENOUS | Status: DC
  Administered 2017-03-13: 01:00:00 via INTRAVENOUS

## 2017-03-13 MED ORDER — ONDANSETRON HCL 4 MG/2ML IJ SOLN: 4.0000 mg | INTRAMUSCULAR | Status: DC | PRN

## 2017-03-13 MED ORDER — FENTANYL CITRATE (PF) 100 MCG/2ML IJ SOLN
50.0000 ug | INTRAMUSCULAR | Status: DC | PRN
  Administered 2017-03-13: 50 ug via INTRAVENOUS

## 2017-03-13 MED ORDER — DIBUCAINE 1 % RE OINT: 1.0000 "application " | TOPICAL_OINTMENT | RECTAL | Status: DC | PRN

## 2017-03-13 MED ORDER — OXYTOCIN 40 UNITS IN LACTATED RINGERS INFUSION - SIMPLE MED
INTRAVENOUS | Status: AC
  Administered 2017-03-13: 500 mL via INTRAVENOUS
  Filled 2017-03-13: qty 1000

## 2017-03-13 MED ORDER — ACETAMINOPHEN 325 MG PO TABS
650.0000 mg | ORAL_TABLET | ORAL | Status: DC | PRN
  Administered 2017-03-13: 650 mg via ORAL

## 2017-03-13 MED ORDER — IBUPROFEN 600 MG PO TABS
600.0000 mg | ORAL_TABLET | Freq: Four times a day (QID) | ORAL | Status: DC
  Administered 2017-03-13 – 2017-03-15 (×9): 600 mg via ORAL
  Filled 2017-03-13 (×9): qty 1

## 2017-03-13 MED ORDER — FLEET ENEMA 7-19 GM/118ML RE ENEM: 1.0000 | ENEMA | RECTAL | Status: DC | PRN

## 2017-03-13 MED ORDER — ONDANSETRON HCL 4 MG/2ML IJ SOLN: 4.0000 mg | Freq: Four times a day (QID) | INTRAMUSCULAR | Status: DC | PRN

## 2017-03-13 MED ORDER — DIPHENHYDRAMINE HCL 50 MG/ML IJ SOLN: 12.5000 mg | INTRAMUSCULAR | Status: DC | PRN

## 2017-03-13 MED ORDER — OXYTOCIN 40 UNITS IN LACTATED RINGERS INFUSION - SIMPLE MED
2.5000 [IU]/h | INTRAVENOUS | Status: DC
  Administered 2017-03-13: 2.5 [IU]/h via INTRAVENOUS

## 2017-03-13 MED ORDER — LACTATED RINGERS IV SOLN: 500.0000 mL | Freq: Once | INTRAVENOUS | Status: DC

## 2017-03-13 MED ORDER — FENTANYL 2.5 MCG/ML BUPIVACAINE 1/10 % EPIDURAL INFUSION (WH - ANES)
14.0000 mL/h | INTRAMUSCULAR | Status: DC | PRN
Start: 2017-03-13 — End: 2017-03-14

## 2017-03-13 MED ORDER — ONDANSETRON HCL 4 MG PO TABS: 4.0000 mg | ORAL_TABLET | ORAL | Status: DC | PRN

## 2017-03-13 MED ORDER — PRENATAL MULTIVITAMIN CH
1.0000 | ORAL_TABLET | Freq: Every day | ORAL | Status: DC
  Administered 2017-03-14: 1 via ORAL
  Filled 2017-03-13: qty 1

## 2017-03-13 MED ORDER — SOD CITRATE-CITRIC ACID 500-334 MG/5ML PO SOLN: 30.0000 mL | ORAL | Status: DC | PRN

## 2017-03-13 NOTE — H&P (Signed)
LABOR AND DELIVERY ADMISSION HISTORY AND PHYSICAL NOTE  Melanie Peterson is a 36 y.o. female [id number] with IUP at 3027w1d by LMP presenting for SROM, active labor.   She reports positive fetal movement. She denies vaginal bleeding. She is uncomfortable with contractions, desires natural birth. Has had 2 previous vaginal deliveries without complication; 2nd baby weighed 10lbs.  Prenatal History/Complications:  Past Medical History: History reviewed. No pertinent past medical history.  Past Surgical History: History reviewed. No pertinent surgical history.  Obstetrical History: OB History    Gravida Para Term Preterm AB Living   4 2 2  0 1 2   SAB TAB Ectopic Multiple Live Births   1 0 0 0 2      Social History: Social History   Social History  . Marital status: Divorced    Spouse name: N/A  . Number of children: N/A  . Years of education: N/A   Social History Main Topics  . Smoking status: Never Smoker  . Smokeless tobacco: Never Used  . Alcohol use No  . Drug use: No  . Sexual activity: Yes   Other Topics Concern  . None   Social History Narrative  . None    Family History: History reviewed. No pertinent family history.  Allergies: No Known Allergies  Prescriptions Prior to Admission  Medication Sig Dispense Refill Last Dose  . Prenatal Vit-Fe Fumarate-FA (MULTIVITAMIN-PRENATAL) 27-0.8 MG TABS tablet Take 1 tablet by mouth daily at 12 noon.   Taking     Review of Systems   All systems reviewed and negative except as stated in HPI  Blood pressure 136/74, pulse 76, temperature 99.3 F (37.4 C), temperature source Oral, resp. rate 18, last menstrual period 06/12/2016, SpO2 100 %. General appearance: alert, cooperative and no distress Lungs: no respiratory distress Heart: regular rate Abdomen: soft, non-tender Extremities: No calf swelling or tenderness Presentation: cephalic by nurse exam Fetal monitoring: baseline 140, minimal variability, accels present  no decels Uterine activity: q2-3 minutes Dilation: 7 Effacement (%): 100 Station: -2 Exam by:: Cletis MediaK. Anderson RN   Prenatal labs: ABO, Rh: O/Positive/-- (03/06 0000) Antibody: Negative (03/06 0000) Rubella: immune RPR: Nonreactive, Nonreactive (03/06 0000)  HBsAg: Negative, Negative (03/06 0000)  HIV: Non-reactive, Non-reactive (03/06 0000)  GBS: Negative (08/29 0000)  Genetic screening:  None Anatomy US: normal  Prenatal Transfer Tool  Maternal Diabetes: No Genetic Screening: none Maternal Ultrasounds/Referrals: Normal Fetal Ultrasounds or other Referrals:  Referred to Materal Fetal Medicine  Maternal Substance Abuse:  No Significant Maternal Medications:  None Significant Maternal Lab Results: Lab values include: Other: sickle cell trait  No results found for this or any previous visit (from the past 24 hour(s)).  Patient Active Problem List   Diagnosis Date Noted  . Normal labor 03/13/2017  . Supervision of other normal pregnancy, antepartum 02/10/2017  . Sickle cell trait (HCC) 02/10/2017  . History of anesthesia complications 02/10/2017  . History of macrosomia in infant in prior pregnancy, currently pregnant 02/10/2017  . UTI in pregnancy, antepartum 02/06/2017    Assessment: Melanie DurandFatima Eads is a 36 y.o. [id number] at 1327w1d here for SROM at 0030  #Labor:progressing naturally #Pain: Natural birth #FWB: Cat 1 tracing #ID:  GBS negative #MOF: both #MOC: pills #Circ:  n/a  Prior hx of fetal macrosomia, US 8/14 shows EFW 89%  Tillman SersAngela C Riccio, DO PGY-2 8/30/20181:09 AM   CNM attestation:  I have seen and examined this patient; I agree with above documentation in the resident's note.   Peabody EnergyFatima  Weisgerber is a 36 y.o. [id number] here for SOL/SROM  PE: BP 134/74   Pulse 82   Temp 98.1 F (36.7 C) (Oral)   Resp 18   Ht 5\' 4"  (1.626 m)   Wt 75.8 kg (167 lb)   LMP 06/12/2016   SpO2 100%   BMI 28.67 kg/m   Resp: normal effort, no distress Abd: gravid  ROS,  labs, PMH reviewed  Plan: Admit to [facility] Expectant management Anticipate SVD  Cam Hai CNM 03/13/2017, 3:59 AM

## 2017-03-13 NOTE — Lactation Note (Signed)
This note was copied from a baby's chart. Lactation Consultation Note  Patient Name: Melanie Glenna DurandFatima Peterson ZOXWR'UToday's Date: 03/13/2017 Reason for consult: Initial assessment   Initial consult with mom of 6 hour old infant. Infant has BF all morning per mom. Infant was cueing to feed and mom had asked nurse for formula. Enc mom to BF infant STS 8-12 x in 24 hours at first feeding cues. Enc mom to massage/compress breast with feeding to assist with milk flow. Mom was able to hand express independently and milk very easily expressible. Enc mom to use hand expression or manual pump to obtain supplement for infant vs formula. Discussed supply and demand, normal progression of milk coming to volume and NB nutritional needs and feeding behavior. Enc parents to maintain feeding log for feedings.  Mom latched infant to the left breast in the side lying position, infant latched easily with flanged lips, rhythmic suckles and intermittent swallows.  Mom experiencing uterine cramping with feeding, notified nurse mom is asking for pain medication. Reassured mom this is a common occurrence and usually lasts a few days after birth. Infant still BF when LC left the room. Mom reports nipple pain with initial latch that improves with feeding.    BF Resources handout and LC Brochure given, mom informed of IP/OP Services, BF Support Groups and LC phone #. Enc mom to call out for assistance as needed. [facility] and does not have a pump at home. Mom reports no further questions/concerns at this time.    Maternal Data Formula Feeding for Exclusion: Yes Reason for exclusion: Mother's choice to formula and breast feed on admission Has patient been taught Hand Expression?: Yes Does the patient have breastfeeding experience prior to this delivery?: Yes  Feeding Feeding Type: Breast Fed Length of feed: 10 min (still BF when LC left room)  LATCH Score Latch: Grasps breast easily, tongue down, lips  flanged, rhythmical sucking.  Audible Swallowing: Spontaneous and intermittent  Type of Nipple: Everted at rest and after stimulation  Comfort (Breast/Nipple): Filling, red/small blisters or bruises, mild/mod discomfort  Hold (Positioning): No assistance needed to correctly position infant at breast.  LATCH Score: 9  Interventions Interventions: Breast feeding basics reviewed;Support pillows;Expressed milk;Hand express  Lactation Tools Discussed/Used WIC Program: No   Consult Status Consult Status: Follow-up Date: 03/14/17 Follow-up type: In-patient    Silas FloodSharon S Janiece Peterson 03/13/2017, 10:35 AM

## 2017-03-14 MED ORDER — IBUPROFEN 600 MG PO TABS: 600.0000 mg | ORAL_TABLET | Freq: Four times a day (QID) | ORAL | 0 refills | Status: DC | PRN

## 2017-03-14 NOTE — Plan of Care (Signed)
Problem: Activity: Goal: Will verbalize the importance of balancing activity with adequate rest periods Outcome: Completed/Met Date Met: 03/14/17 Patient eating, ambulating, and voiding independently without any issues Goal: Ability to tolerate increased activity will improve Outcome: Completed/Met Date Met: 03/14/17 Patient eating, ambulating, and voiding independently without any issues  Problem: Nutritional: Goal: Dietary intake will improve Outcome: Completed/Met Date Met: 03/14/17 Patient eating, ambulating, and voiding independently without any issues  Problem: Urinary Elimination: Goal: Ability to reestablish a normal urinary elimination pattern will improve Outcome: Completed/Met Date Met: 03/14/17 Patient eating, ambulating, and voiding independently without any issues

## 2017-03-14 NOTE — Progress Notes (Signed)
Post Partum Day 1 Subjective: no complaints, up ad lib, voiding, tolerating PO and + flatus  Objective: Blood pressure 138/75, pulse (!) 54, temperature 98.4 F (36.9 C), temperature source Oral, resp. rate 18, height 5\' 4"  (1.626 m), weight 75.8 kg (167 lb), last menstrual period 06/12/2016, SpO2 99 %.  Physical Exam:  General: alert and cooperative Lochia: appropriate Uterine Fundus: firm DVT Evaluation: No evidence of DVT seen on physical exam.   Recent Labs  03/13/17 0045  HGB 11.7*  HCT 34.8*    Assessment/Plan: Plan for discharge tomorrow and Contraception POP   LOS: 1 day   [name] 03/14/2017, 7:18 AM

## 2017-03-14 NOTE — Lactation Note (Signed)
This note was copied from a baby's chart. Lactation Consultation Note  Patient Name: Melanie Peterson ZOXWR'UToday's Date: 03/14/2017 Reason for consult: Follow-up assessment;Nipple pain/trauma;Term  Visited with Mom, baby 735 hrs old.  Mom complaining of sore nipples.  Mom's breasts filling, and nipples have some slight abrasion on tips.  Offered a latch assist.  Baby placed in football hold on right breast.  Basics on positioning and support.  Showed Mom how to sandwich breast to facilitate a deeper latch to breast.  Baby slipped off a deep latch a couple times.  Repositioned baby and Mom's hand placement on breast.  Baby was able to attain a deep latch.  Multiple swallows identified for Mom and FOB.  Demonstrated to FOB how to pull on chin to open baby's mouth wider on breast.  Mom denies any discomfort.  Uterine cramping felt by Mom. Demonstrated alternate breast compression to increase milk transfer.  Encouraged Mom to keep baby STS, and feed often on cue. Mom to call prn for assistance with latching. Coconut oil given to Mom with instructions on use.  Hand pump given and demonstrated use and cleaning.     Feeding Feeding Type: Breast Fed Length of feed: 10 min  LATCH Score Latch: Grasps breast easily, tongue down, lips flanged, rhythmical sucking.  Audible Swallowing: Spontaneous and intermittent  Type of Nipple: Everted at rest and after stimulation  Comfort (Breast/Nipple): Filling, red/small blisters or bruises, mild/mod discomfort (breasts filling, and nipples sore)  Hold (Positioning): Assistance needed to correctly position infant at breast and maintain latch.  LATCH Score: 8  Interventions Interventions: Breast feeding basics reviewed;Assisted with latch;Skin to skin;Breast massage;Hand express;Pre-pump if needed;Breast compression;Adjust position;Support pillows;Position options;Expressed milk;Coconut oil;Hand pump   Consult Status Consult Status: Follow-up Date:  03/15/17 Follow-up type: In-patient    Judee ClaraSmith, Billiejean Schimek E 03/14/2017, 3:12 PM

## 2017-03-14 NOTE — Discharge Summary (Signed)
OB Discharge Summary     Patient Name: Melanie Peterson DOB: 1980/12/03 MRN: [id number]  Date of admission: 03/12/2017 Delivering MD: Julieanne Cotton T   Date of discharge: 03/14/2017  Admitting diagnosis: 39WKS CTX Intrauterine pregnancy: [redacted]w[redacted]d     Secondary diagnosis:  Active Problems:   Normal labor  Additional problems: hx macrosomic infant (10lbs); GBS neg     Discharge diagnosis: Term Pregnancy Delivered                                                                                                Post partum procedures:none  Augmentation: none  Complications: None  Hospital course:  Onset of Labor With Vaginal Delivery     36 y.o. yo [id number] at [redacted]w[redacted]d was admitted in Active Labor on 03/12/2017. Patient had an uncomplicated labor course as follows:  Membrane Rupture Time/Date: 12:25 AM ,03/13/2017   Intrapartum Procedures: Episiotomy: None [1]                                         Lacerations:  None [1]  Patient had a delivery of a Viable infant. 03/13/2017  Information for the patient's newborn:  Kayelyn, Lemon Girl Juliona [[id number]]  Delivery Method: Vaginal, Spontaneous Delivery (Filed from Delivery Summary)    Pateint had an uncomplicated postpartum course.  She is ambulating, tolerating a regular diet, passing flatus, and urinating well. Patient is discharged home in stable condition on 03/14/17. She desired early discharge as long as baby would be cleared by peds for d/c.   Physical exam  Vitals:   03/13/17 0600 03/13/17 0939 03/13/17 1704 03/14/17 0551  BP: (!) 120/58 120/69 (!) 110/58 138/75  Pulse: (!) 53 66 70 (!) 54  Resp: 18 16 18 18   Temp: 99.4 F (37.4 C) 98.2 F (36.8 C) 98.6 F (37 C) 98.4 F (36.9 C)  TempSrc: Oral Oral Oral Oral  SpO2:  99%    Weight:      Height:       General: alert and cooperative Lochia: appropriate Uterine Fundus: firm Incision: N/A DVT Evaluation: No evidence of DVT seen on physical exam. Labs: Lab Results   Component Value Date   WBC 7.3 03/13/2017   HGB 11.7 (L) 03/13/2017   HCT 34.8 (L) 03/13/2017   MCV 77.5 (L) 03/13/2017   PLT 132 (L) 03/13/2017   No flowsheet data found.  Discharge instruction: per After Visit Summary and "Baby and Me Booklet".  After visit meds:  Allergies as of 03/14/2017   No Known Allergies     Medication List    TAKE these medications   ibuprofen 600 MG tablet Commonly known as:  ADVIL,MOTRIN Take 1 tablet (600 mg total) by mouth every 6 (six) hours as needed.   multivitamin-prenatal 27-0.8 MG Tabs tablet Take 1 tablet by mouth daily at 12 noon.            Discharge Care Instructions        Start     Ordered  03/14/17 0000  ibuprofen (ADVIL,MOTRIN) 600 MG tablet  Every 6 hours PRN    Question:  Supervising Provider  Answer:  Lesly DukesLEGGETT, KELLY H   03/14/17 [id number]   03/14/17 0000  Discharge patient    Question Answer Comment  Discharge disposition 01-Home or Self Care   Discharge patient date 03/14/2017      03/14/17 0906   03/12/17 0000  OB RESULT CONSOLE Group B Strep    Comments:  This external order was created through the Results Console.   03/12/17 2324      Diet: routine diet  Activity: Advance as tolerated. Pelvic rest for 6 weeks.   Outpatient follow up:4 weeks Follow up Appt: not sched yet Follow up Visit:No Follow-up on file.  Postpartum contraception: Progesterone only pills  Newborn Data: Live born female  Birth Weight: 8 lb 3.2 oz (3719 g) APGAR: 9, 10  Baby Feeding: Breast Disposition:home with mother   03/14/2017 Cam HaiSHAW, Zealand Boyett, CNM  11:18 AM

## 2017-03-14 NOTE — Discharge Instructions (Signed)

## 2017-03-15 NOTE — Lactation Note (Signed)
This note was copied from a baby's chart. Lactation Consultation Note  Patient Name: Girl Glenna DurandFatima Hinnenkamp ZOXWR'UToday's Date: 03/15/2017 Reason for consult: Follow-up assessment  Baby 53 hours old. Mom reports that she nursed first 2 children 18 and 12 months respectively. Mom reports that she intends to offer breast as she did with first 2 children. Mom denies any nipple pain. Offered to assist with latch, but mom declined. Enc to call for assistance as needed.   Maternal Data    Feeding    LATCH Score                   Interventions    Lactation Tools Discussed/Used     Consult Status Consult Status: PRN    Sherlyn HayJennifer D Jessikah Dicker 03/15/2017, 9:31 AM

## 2017-03-15 NOTE — Discharge Summary (Signed)
OB Discharge Summary                           Patient Name: Melanie Peterson DOB: [birth year] MRN: [id number]  Date of admission: 03/12/2017 Delivering MD: Julieanne Cotton T   Date of discharge: 03/14/2017  Admitting diagnosis: 39WKS CTX Intrauterine pregnancy: [redacted]w[redacted]d     Secondary diagnosis:  Active Problems:   Normal labor  Additional problems: hx macrosomic infant (10lbs); GBS neg                                      Discharge diagnosis: Term Pregnancy Delivered                                                                                                Post partum procedures:none  Augmentation: none  Complications: None  Hospital course:  Onset of Labor With Vaginal Delivery     36 y.o. yo [id number] at [redacted]w[redacted]d was admitted in Active Labor on 03/12/2017. Patient had an uncomplicated labor course as follows:  Membrane Rupture Time/Date: 12:25 AM ,03/13/2017   Intrapartum Procedures: Episiotomy: None [1]                                         Lacerations:  None [1]  Patient had a delivery of a Viable infant. 03/13/2017  Information for the patient's newborn:  Melanie Peterson, Melanie Peterson Girl Melanie Peterson [[id number]]  Delivery Method: Vaginal, Spontaneous Delivery (Filed from Delivery Summary)    Pateint had an uncomplicated postpartum course.  She is ambulating, tolerating a regular diet, passing flatus, and urinating well. Patient is discharged home in stable condition on 03/15/17. Pt had expressed interest in being discharged yesterday, but the Melanie was not released so she stayed an additional night.   Physical exam  Vitals:   03/14/17 1700 03/15/17 0540  BP: 112/62 116/70  Pulse: 61 (!) 53  Resp: 18 18  Temp: 98.3 F (36.8 C) 98.2 F (36.8 C)  SpO2: 100%    General: alert and cooperative Lochia: appropriate Uterine Fundus: firm Incision: N/A DV Evaluation: No evidence of DVT seen on physical exam. Labs: Recent Labs       Lab Results  Component Value Date   WBC 7.3  03/13/2017   HGB 11.7 (L) 03/13/2017   HCT 34.8 (L) 03/13/2017   MCV 77.5 (L) 03/13/2017   PLT 132 (L) 03/13/2017     No flowsheet data found.  Discharge instruction: per After Visit Summary and "Melanie Peterson Booklet".  After visit meds:  Allergies as of 03/14/2017   No Known Allergies                          Medication List               TAKE these medications  ibuprofen 600 MG tablet Commonly known as:  ADVIL,MOTRIN Take 1 tablet (600 mg total) by mouth every 6 (six) hours as needed.    multivitamin-prenatal 27-0.8 MG Tabs tablet Take 1 tablet by mouth daily at 12 noon.                                         Discharge Care Instructions               Start     Ordered   03/14/17 0000  ibuprofen (ADVIL,MOTRIN) 600 MG tablet  Every 6 hours PRN    Question:  Supervising Provider  Answer:  Lesly DukesLEGGETT, KELLY H   03/14/17 [id number]   03/14/17 0000  Discharge patient    Question Answer Comment  Discharge disposition 01-Home or Self Care   Discharge patient date 03/14/2017      03/14/17 0906   03/12/17 0000  OB RESULT CONSOLE Group B Strep    Comments:  This external order was created through the Results Console.   03/12/17 2324      Diet: routine diet  Activity: Advance as tolerated. Pelvic rest for 6 weeks.   Outpatient follow up:4 weeks Follow up Appt: not sched yet Follow up Visit:No Follow-up on file.  Postpartum contraception: Progesterone only pills  Newborn Data: Live born female  Birth Weight: 8 lb 3.2 oz (3719 g) APGAR: 9, 10  Melanie Feeding: Breast Disposition:home with mother  Cam HaiSHAW, [name] [facility]NM 03/15/2017 7:16 AM

## 2017-03-18 ENCOUNTER — Encounter: Payer: Medicaid Other | Admitting: Advanced Practice Midwife

## 2017-03-19 ENCOUNTER — Inpatient Hospital Stay (HOSPITAL_COMMUNITY): Admission: RE | Admit: 2017-03-19 | Payer: Medicaid Other | Source: Ambulatory Visit

## 2017-04-24 ENCOUNTER — Encounter: Payer: Self-pay | Admitting: Student

## 2017-04-24 ENCOUNTER — Ambulatory Visit: Payer: Self-pay | Admitting: Student

## 2017-04-30 ENCOUNTER — Encounter: Payer: Self-pay | Admitting: Student

## 2017-04-30 ENCOUNTER — Ambulatory Visit (INDEPENDENT_AMBULATORY_CARE_PROVIDER_SITE_OTHER): Payer: Medicaid Other | Admitting: Student

## 2017-04-30 VITALS — BP 121/75 | HR 69 | Wt 151.4 lb

## 2017-04-30 DIAGNOSIS — Z3042 Encounter for surveillance of injectable contraceptive: Secondary | ICD-10-CM | POA: Diagnosis not present

## 2017-04-30 DIAGNOSIS — Z3480 Encounter for supervision of other normal pregnancy, unspecified trimester: Secondary | ICD-10-CM

## 2017-04-30 DIAGNOSIS — Z3202 Encounter for pregnancy test, result negative: Secondary | ICD-10-CM | POA: Diagnosis not present

## 2017-04-30 DIAGNOSIS — Z1389 Encounter for screening for other disorder: Secondary | ICD-10-CM

## 2017-04-30 DIAGNOSIS — Z7251 High risk heterosexual behavior: Secondary | ICD-10-CM

## 2017-04-30 LAB — POCT PREGNANCY, URINE: PREG TEST UR: NEGATIVE

## 2017-04-30 MED ORDER — MEDROXYPROGESTERONE ACETATE 150 MG/ML IM SUSP
150.0000 mg | Freq: Once | INTRAMUSCULAR | Status: AC
  Administered 2017-04-30: 150 mg via INTRAMUSCULAR

## 2017-04-30 MED ORDER — MEDROXYPROGESTERONE ACETATE 150 MG/ML IM SUSP: 150.0000 mg | Freq: Once | INTRAMUSCULAR | Status: DC

## 2017-04-30 NOTE — Addendum Note (Signed)
Addended by: Lorelle GibbsWILSON, CHIQUITA L on: 04/30/2017 04:29 PM   Modules accepted: Orders

## 2017-04-30 NOTE — Progress Notes (Signed)
Subjective:     Melanie Peterson is a 36 y.o. female who presents for a postpartum visit. She is 6 weeks postpartum following a spontaneous vaginal delivery. I have fully reviewed the prenatal and intrapartum course. The delivery was at 39 gestational weeks. Outcome: spontaneous vaginal delivery. Anesthesia: none. Postpartum course has been uneventful. Baby's course has been uneventful. Baby is feeding by both breast and bottle - Similac Advance. Bleeding no bleeding. Bowel function is normal. Bladder function is normal. Patient is sexually active. Contraception method is none. Postpartum depression screening: negative.  Patient does not desire to be pregnant at this time. However, her husband is against birth control because he feels it is unsafe for her. Her husband also agrees that another pregnancy in quick succession would be unsafe for her as well.   The following portions of the patient's history were reviewed and updated as appropriate: allergies, current medications, past family history, past medical history, past social history, past surgical history and problem list.  Patient is feeling Review of Systems Pertinent items are noted in HPI.   Objective:    BP 121/75   Pulse 69   Wt 151 lb 6.4 oz (68.7 kg)   Breastfeeding? Yes   BMI 25.99 kg/m   General:  alert, cooperative and no distress   Breasts:  inspection negative, no nipple discharge or bleeding, no masses or nodularity palpable  Lungs: clear to auscultation bilaterally  Heart:  regular rate and rhythm, S1, S2 normal, no murmur, click, rub or gallop  Abdomen: soft, non-tender; bowel sounds normal; no masses,  no organomegaly   Vulva:  not evaluated  Vagina: not evaluated  Cervix:  not evaluated  Corpus: not examined  Adnexa:  not evaluated  Rectal Exam: Not performed.        Assessment:     Normal postpartum exam. Pap smear not done at today's visit.   Plan:    1. Contraception: Depo-Provera injections. Pregnancy  test is negative today; plan to return for Depo in 3 months.  2. Patient to take pregnancy test in two weeks. Counseled patient that if she is pregnant Depo will not harm pregnancy but she may not realize she is pregnant. Patient agrees to take pregnancy test in two weeks.  3. Follow up in: 3 months or as needed.   4. Pap in two years.  Luna KitchensKathryn Ofilia Peterson

## 2017-05-01 LAB — PREGNANCY, URINE: Preg Test, Ur: NEGATIVE

## 2017-07-17 ENCOUNTER — Encounter: Payer: Self-pay | Admitting: Student

## 2017-07-17 ENCOUNTER — Ambulatory Visit (INDEPENDENT_AMBULATORY_CARE_PROVIDER_SITE_OTHER): Payer: Medicaid Other | Admitting: Student

## 2017-07-17 VITALS — BP 112/69 | HR 64 | Ht 64.0 in | Wt 150.0 lb

## 2017-07-17 DIAGNOSIS — N939 Abnormal uterine and vaginal bleeding, unspecified: Secondary | ICD-10-CM

## 2017-07-17 MED ORDER — LEVONORGEST-ETH EST & ETH EST 42-21-21-7 DAYS PO TABS: 1.0000 | ORAL_TABLET | Freq: Every day | ORAL | 3 refills | Status: DC

## 2017-07-17 MED ORDER — MEDROXYPROGESTERONE ACETATE 10 MG PO TABS: 10.0000 mg | ORAL_TABLET | Freq: Every day | ORAL | 0 refills | Status: DC

## 2017-07-17 MED ORDER — NORGESTIMATE-ETH ESTRADIOL 0.25-35 MG-MCG PO TABS
1.0000 | ORAL_TABLET | Freq: Every day | ORAL | 11 refills | Status: DC
Start: 2017-07-17 — End: 2017-12-31

## 2017-07-17 NOTE — Patient Instructions (Addendum)
-  take PROVERA (medroxyprogesterone) 10 mg for 5 days. Take one pill a day. If bleeding hasn't stopped, take for another 5 days.   -Start taking birth control called Ortho-cyclin.   -DO NOT take levonogesteral pill  -Come back in two months if unhappy with bleeding.

## 2017-07-17 NOTE — Progress Notes (Signed)
Subjective:     Patient ID: Melanie DurandFatima Heathcock, female   DOB: 07-22-1980, 37 y.o.   MRN: [id number]  HPI Patient Melanie Peterson is a 37 y.o. [id number] here who wants to discuss a different method of birth control. She has had on Depo shot in October, but states that she is still bleeding. She says that the bleeding is not getting better. Some days it is worse and some days it is light, but overall it is not better. She wants to change methods but she does not know what kind of method she wants. She is also asking for medication to stop the bleeding. Her situation is complicated by the fact that as a Muslim, she cannot pray while she is bleeding. Her husband does not want her to have any more children, but he also does not want her to be on birth control. She is very confused about what to do.  She would like to have another child in two years; plans to start trying again when he baby born in August 2018 is 773 year old (about 8 months from now).  Review of Systems  Constitutional: Negative.   HENT: Negative.   Respiratory: Negative.   Cardiovascular: Negative.   Gastrointestinal: Negative.   Genitourinary: Positive for vaginal bleeding.  Musculoskeletal: Negative.   Neurological: Negative.   Psychiatric/Behavioral: Negative.        Objective:   Physical Exam  Constitutional: She is oriented to person, place, and time. She appears well-developed.  HENT:  Head: Normocephalic.  Neck: Normal range of motion.  Pulmonary/Chest: Effort normal.  Abdominal: Soft.  Genitourinary: Vagina normal.  Musculoskeletal: Normal range of motion.  Neurological: She is alert and oriented to person, place, and time.  Skin: Skin is warm and dry.  Psychiatric: She has a normal mood and affect.       Assessment:     After much discussion, patient would like to try Provera for 5 days and then switch to Sprintec. She understands that her bleeding may not resolve right away, and agrees to try this regimen for at  least 2 months. She will come back if she wants to discuss changing her birth control again.     Plan:     1. Abnormal uterine bleeding    2. Patient to take Provera for 5 days, and then start Sprintec. Advised not to have intercourse until she has been on Sprintec for at least 7 days.   3. All questions answered; patient and provider reviewed my written instructions together.  Luna KitchensKathryn Kooistra CNM

## 2017-10-04 ENCOUNTER — Emergency Department (HOSPITAL_COMMUNITY)
Admission: EM | Admit: 2017-10-04 | Discharge: 2017-10-04 | Disposition: A | Discharge status: Home / Self Care | Payer: Medicaid Other | Attending: Emergency Medicine | Admitting: Emergency Medicine

## 2017-10-04 ENCOUNTER — Encounter (HOSPITAL_COMMUNITY): Payer: Self-pay | Admitting: *Deleted

## 2017-10-04 DIAGNOSIS — Z3202 Encounter for pregnancy test, result negative: Secondary | ICD-10-CM

## 2017-10-04 DIAGNOSIS — Z79899 Other long term (current) drug therapy: Secondary | ICD-10-CM | POA: Insufficient documentation

## 2017-10-04 LAB — URINALYSIS, ROUTINE W REFLEX MICROSCOPIC
Bilirubin Urine: NEGATIVE
GLUCOSE, UA: NEGATIVE mg/dL
Hgb urine dipstick: NEGATIVE
Ketones, ur: NEGATIVE mg/dL
LEUKOCYTES UA: NEGATIVE
Nitrite: NEGATIVE
Protein, ur: NEGATIVE mg/dL
Specific Gravity, Urine: 1.016 (ref 1.005–1.030)
pH: 5 (ref 5.0–8.0)

## 2017-10-04 LAB — POC URINE PREG, ED: PREG TEST UR: NEGATIVE

## 2017-10-04 NOTE — Discharge Instructions (Addendum)
Your pregnancy test was negative today. Your urinalysis did not show evidence of UTI. Your offered further evaluation of your vaginal discharge but deferred at this time. Please follow with your primary care provider or OBGYN. If you develop worsening or new concerning symptoms you can return to the emergency department for re-evaluation.

## 2017-10-04 NOTE — ED Provider Notes (Signed)
[facility]-EMERGENCY DEPT Provider Note   CSN: [id number] Arrival date & time: 10/04/17  1304     History   Chief Complaint Chief Complaint  Patient presents with  . Generalized Body Aches    HPI Melanie DurandFatima Peterson is a 37 y.o. female who presents the emergency department today for concerns she may be pregnant.  Patient states that she is concerned she may be pregnant as she recently became sexually active and has not been using barrier protection.  She is sexually active with one female partner.  She is currently on birth control pills but does not take this regularly.  She notes that she has clear/white vaginal discharge over the last 1 week.  She reports that this morning when she awoke she was nauseous and had one episode of emesis.  She says is similar to the morning sickness that she had when she was pregnant.  She would like a pregnancy test to ensure she is not pregnant.  There is no associated abdominal pain with this.  The patient denies any URI symptoms, fever, chills, body aches, abdominal pain, diarrhea.  She does state she has been urinating more often but denies any dysuria, hematuria, urgency, or foul odor of her urine.  No flank pain.  She has taken Tylenol for her symptoms without relief.  The patient denies any pelvic pain, vaginal bleeding, vaginal itching, ulcers/lesions.  HPI  History reviewed. No pertinent past medical history.  Patient Active Problem List   Diagnosis Date Noted  . Abnormal uterine bleeding 07/17/2017  . Normal labor 03/13/2017  . Sickle cell trait (HCC) 02/10/2017  . History of anesthesia complications 02/10/2017  . History of macrosomia in infant in prior pregnancy, currently pregnant 02/10/2017    History reviewed. No pertinent surgical history.   OB History    Gravida  4   Para  2   Term  2   Preterm  0   AB  1   Living  2     SAB  1   TAB  0   Ectopic  0   Multiple  0   Live Births  2             Home Medications    Prior to Admission medications   Medication Sig Start Date End Date Taking? Authorizing Provider  ibuprofen (ADVIL,MOTRIN) 600 MG tablet Take 1 tablet (600 mg total) by mouth every 6 (six) hours as needed. Patient not taking: Reported on 04/30/2017 03/14/17   Arabella MerlesShaw, Kimberly D, CNM  medroxyPROGESTERone (PROVERA) 10 MG tablet Take 1 tablet (10 mg total) by mouth daily. 07/17/17   Marylene LandKooistra, Kathryn Lorraine, CNM  norgestimate-ethinyl estradiol (ORTHO-CYCLEN,SPRINTEC,PREVIFEM) 0.25-35 MG-MCG tablet Take 1 tablet by mouth daily. 07/17/17   Marylene LandKooistra, Kathryn Lorraine, CNM  Prenatal Vit-Fe Fumarate-FA (MULTIVITAMIN-PRENATAL) 27-0.8 MG TABS tablet Take 1 tablet by mouth daily at 12 noon.    [provider]    Family History No family history on file.  Social History Social History   Tobacco Use  . Smoking status: Never Smoker  . Smokeless tobacco: Never Used  Substance Use Topics  . Alcohol use: No  . Drug use: No     Allergies   Patient has no known allergies.   Review of Systems Review of Systems  All other systems reviewed and are negative.    Physical Exam Updated Vital Signs BP 118/67 (BP Location: Left Arm)   Pulse 97   Temp 98.9 F (37.2 C) (Oral)  Resp 18   LMP 09/13/2017   SpO2 98%   Physical Exam  Constitutional: She appears well-developed and well-nourished.  HENT:  Head: Normocephalic and atraumatic.  Right Ear: Tympanic membrane and external ear normal.  Left Ear: Tympanic membrane and external ear normal.  Nose: Nose normal.  Mouth/Throat: Uvula is midline, oropharynx is clear and moist and mucous membranes are normal. No tonsillar exudate.  Eyes: Pupils are equal, round, and reactive to light. Conjunctivae are normal. Right eye exhibits no discharge. Left eye exhibits no discharge. No scleral icterus.  Neck: Trachea normal. Neck supple. No spinous process tenderness present. No neck rigidity. Normal range of motion  present.  Cardiovascular: Normal rate, regular rhythm and intact distal pulses.  No murmur heard. Pulses:      Radial pulses are 2+ on the right side, and 2+ on the left side.       Dorsalis pedis pulses are 2+ on the right side, and 2+ on the left side.       Posterior tibial pulses are 2+ on the right side, and 2+ on the left side.  No lower extremity swelling or edema. Calves symmetric in size bilaterally.  Pulmonary/Chest: Effort normal and breath sounds normal. No respiratory distress. She exhibits no tenderness.  Abdominal: Soft. Bowel sounds are normal. She exhibits no distension. There is no tenderness. There is no rigidity, no rebound, no guarding and no CVA tenderness.  Musculoskeletal: She exhibits no edema.  Lymphadenopathy:    She has no cervical adenopathy.  Neurological: She is alert.  Skin: Skin is warm and dry. No rash noted. She is not diaphoretic. No pallor.  Psychiatric: She has a normal mood and affect.  Nursing note and vitals reviewed.    ED Treatments / Results  Labs (all labs ordered are listed, but only abnormal results are displayed) Labs Reviewed  URINALYSIS, ROUTINE W REFLEX MICROSCOPIC  POC URINE PREG, ED    EKG None  Radiology No results found.  Procedures Procedures (including critical care time)  Medications Ordered in ED Medications - No data to display   Initial Impression / Assessment and Plan / ED Course  I have reviewed the triage vital signs and the nursing notes.  Pertinent labs & imaging results that were available during my care of the patient were reviewed by me and considered in my medical decision making (see chart for details).     37 y.o. female who presents the emergency department today for concerns she may be pregnant.  Patient states that she is concerned she may be pregnant as she recently became sexually active and has not been using barrier protection.  She is sexually active with one female partner.  She is currently  on birth control pills but does not take this regularly.  She notes that she has clear/white vaginal discharge over the last 1 week.  She reports that this morning when she awoke she was nauseous and had one episode of emesis.  She says is similar to the morning sickness that she had when she was pregnant.  She would like a pregnancy test to ensure she is not pregnant.  There is no associated abdominal pain with this.  The patient denies any URI symptoms, fever, chills, body aches, abdominal pain, diarrhea.  She does state she has been urinating more often but denies any dysuria, hematuria, urgency, or foul odor of her urine.  No flank pain.  She has taken Tylenol for her symptoms without relief.  The patient  denies any pelvic pain, vaginal bleeding, vaginal itching, ulcers/lesions.  Patient vital signs are reassuring.  No fever, tachycardia, tachypnea, hypoxia or hypotension.  Abdomen is soft and nontender.  Patient is currently asymptomatic and tolerating PO fluids.  Patient's pregnancy test negative.  No evidence of UTI on urinalysis.  Discussed with patient that blood work and pelvic exam would be the next steps in evaluation of her symptoms.  Patient states that she would like to defer these at this time and follow with her primary care provider if her symptoms persist.  I advised the patient to follow-up with PCP this week. Specific return precautions discussed. Time was given for all questions to be answered. The patient verbalized understanding and agreement with plan. The patient appears safe for discharge home.  Final Clinical Impressions(s) / ED Diagnoses   Final diagnoses:  Pregnancy examination or test, negative result    ED Discharge Orders    None       Jacinto Halim, PA-C 10/04/17 1911    Melene Plan, DO 10/04/17 2241

## 2017-10-04 NOTE — ED Triage Notes (Signed)
Pt complains of generalized body aches and chills x 2 days, emesis this morning. Pt denies diarrhea, cough.

## 2017-10-18 IMAGING — US US MFM OB DETAIL+14 WK
1 series · 14 of 28 positions shown · non-contrast
Comparison: none

[Series 1: us mfm ob detail+14 wk · 14 of 73 slices shown]
[im 3/73]
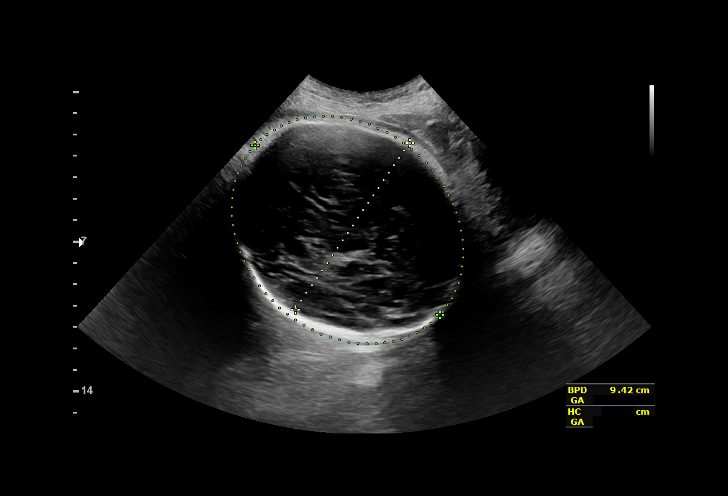
[im 9/73]
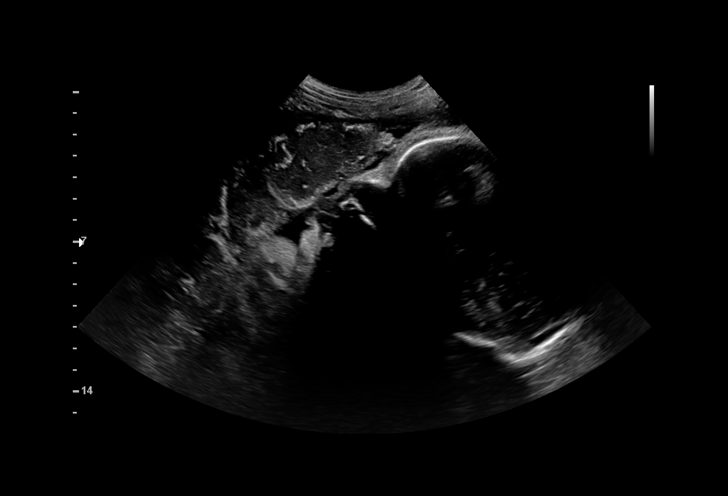
[im 14/73]
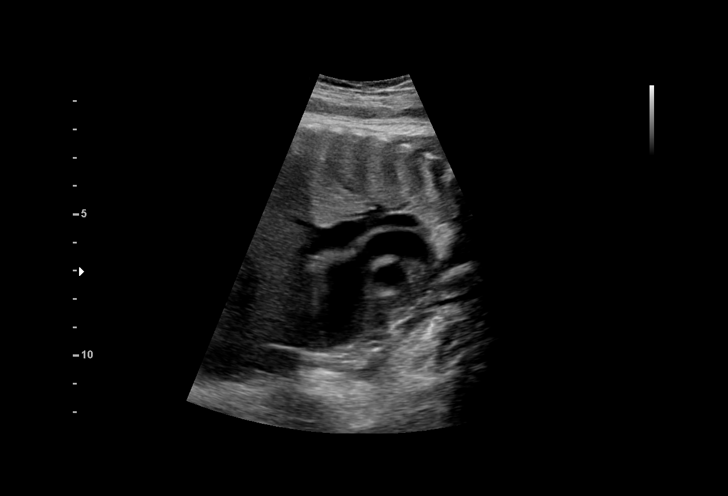
[im 19/73]
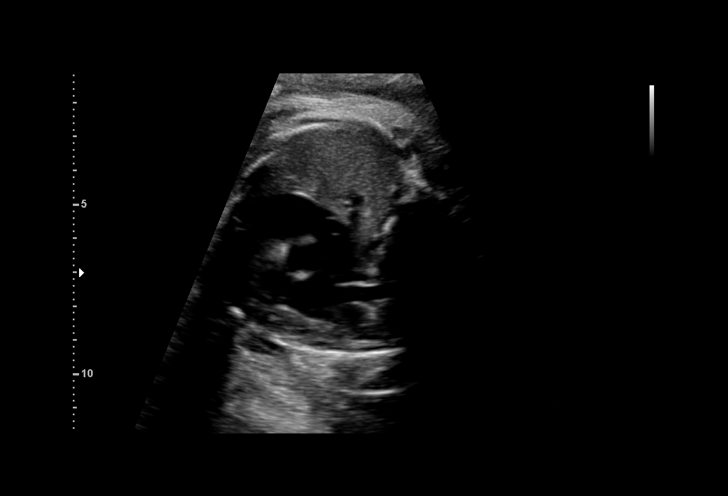
[im 25/73]
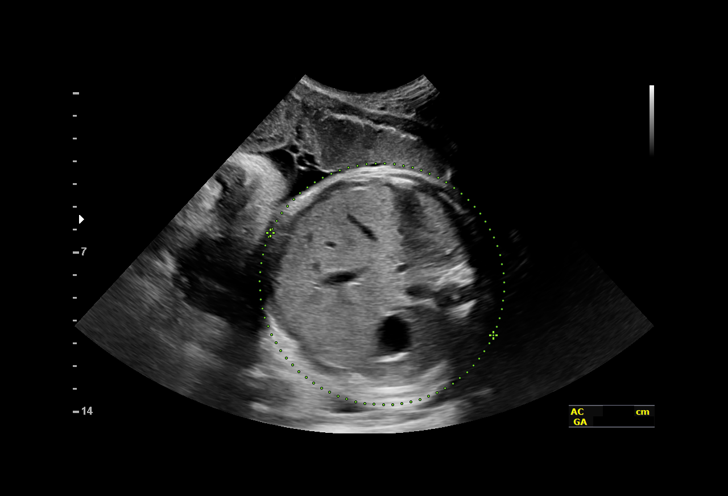
[im 30/73]
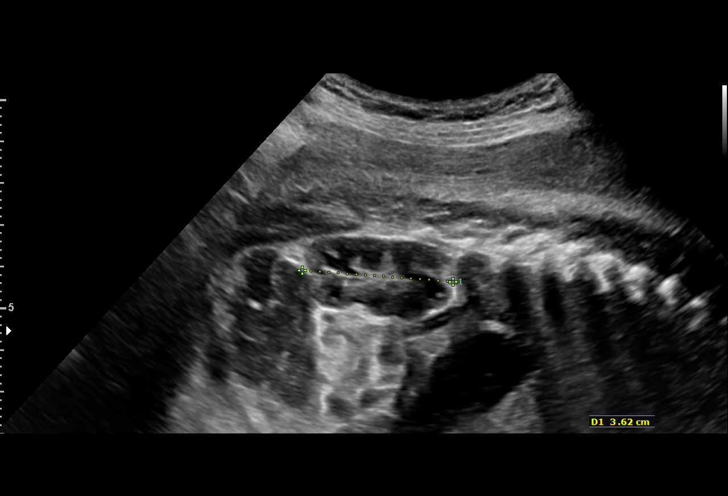
[im 35/73]
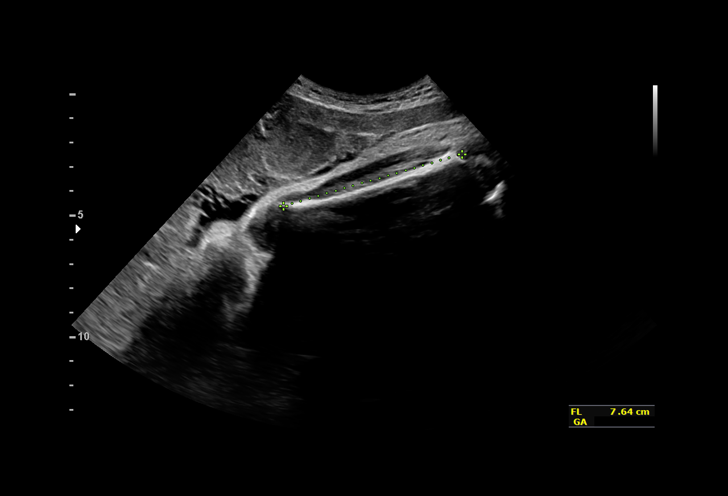
[im 41/73]
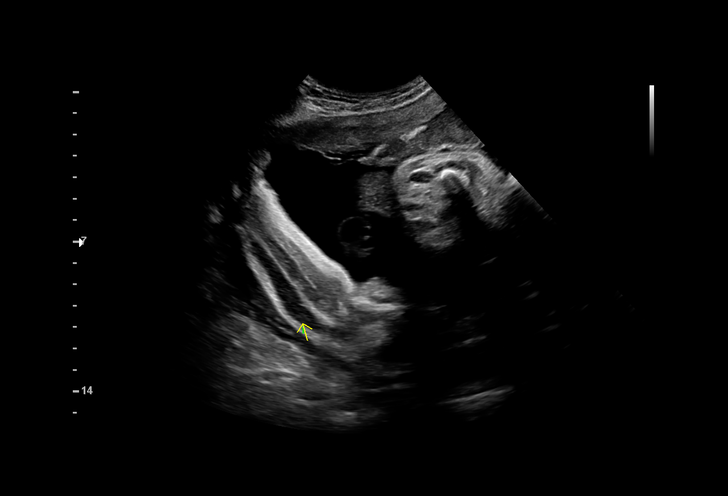
[im 46/73]
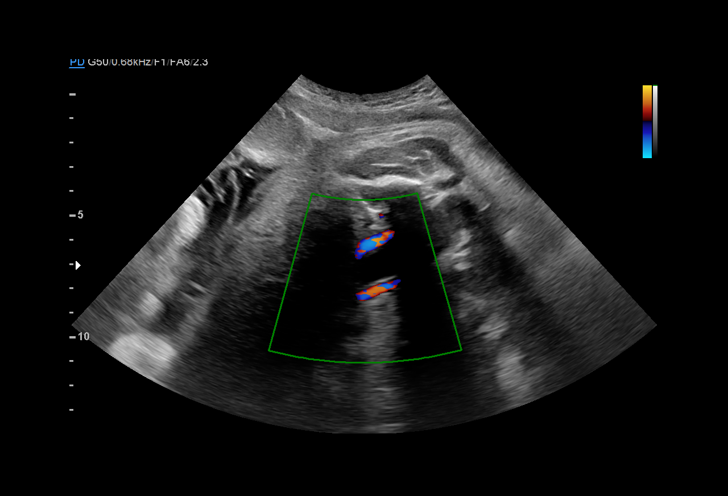
[im 51/73]
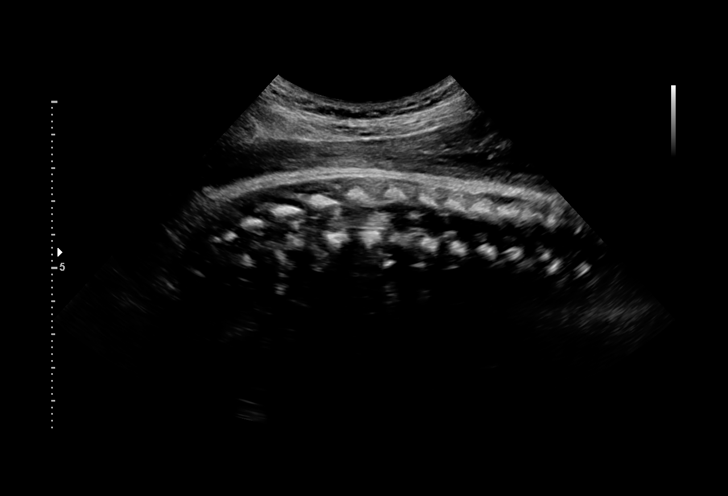
[im 57/73]
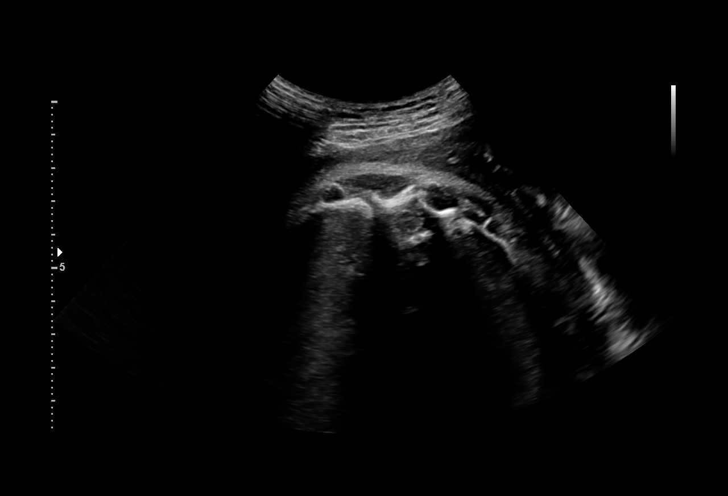
[im 62/73]
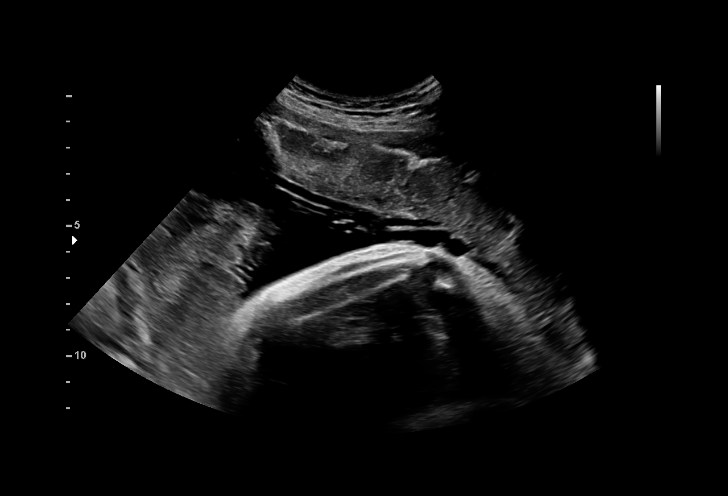
[im 67/73]
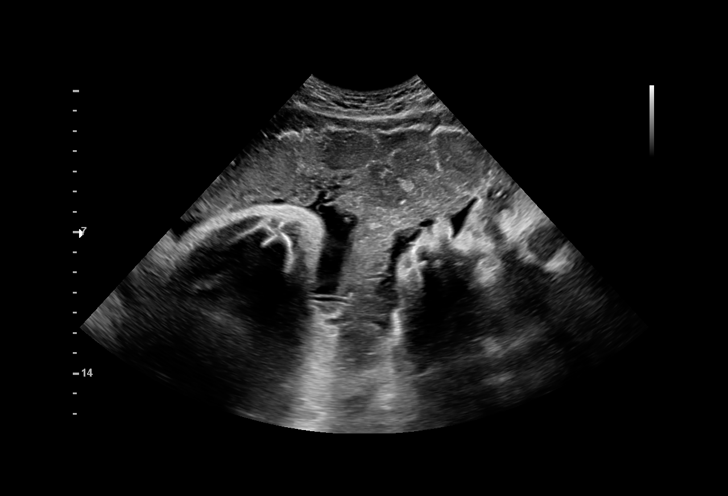
[im 73/73]
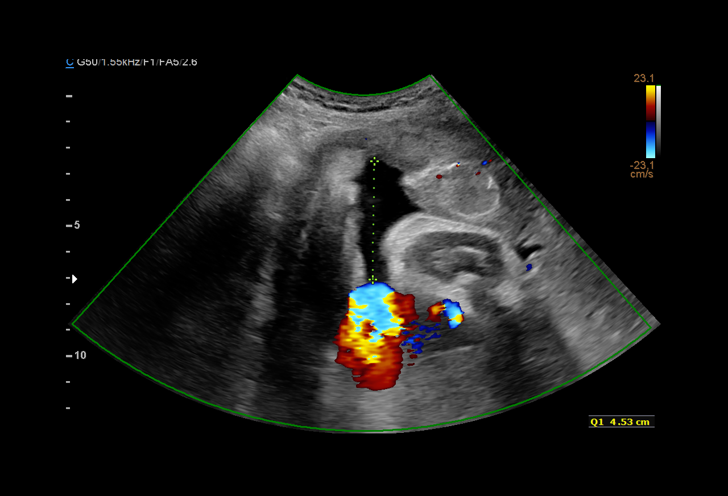

[14 of 28 positions shown; findings below may reference images not displayed]

[facility] [HOSPITAL]

1  IZAIAH DELAFUENTE         [id number]      [id number]     [id number]
Indications

36 weeks gestation of pregnancy
Encounter for fetal anatomic survey
History of sickle cell trait
Maternal care for (suspected) hereditary
disease in fetus, not applicable or
unspecified
Poor obstetric history: Prior fetal
macrosomia, antepartum
OB History

Blood Type:            Height:  5'6"   Weight (lb):  166       BMI:
Gravidity:    4         Term:   2         SAB:   1
Living:       2
Fetal Evaluation

Num Of Fetuses:     1
Fetal Heart         148
Rate(bpm):
Cardiac Activity:   Observed
Presentation:       Cephalic
Placenta:           Anterior, above cervical os
P. Cord Insertion:  Not well visualized

Amniotic Fluid
AFI FV:      Subjectively within normal limits

AFI Sum(cm)     %Tile       Largest Pocket(cm)
10.23           25

RUQ(cm)       RLQ(cm)       LUQ(cm)        LLQ(cm)
4.53          1.66          4.04           0
Biometry

BPD:      94.8  mm     G. Age:  38w 4d         96  %    CI:          79.1  %    70 - 86
FL/HC:       22.6  %    20.8 -
HC:        337  mm     G. Age:  38w 4d         69  %    HC/AC:       1.00       0.92 -
AC:      335.6  mm     G. Age:  37w 3d         81  %    FL/BPD:      80.3  %    71 - 87
FL:       76.1  mm     G. Age:  39w 0d         92  %    FL/AC:       22.7  %    20 - 24
HUM:      66.6  mm     G. Age:  38w 5d       > 95  %

Est. FW:    4484   gm     7 lb 8 oz     89  %
Gestational Age

LMP:           36w 5d        Date:  06/12/16                 EDD:   03/19/17
U/S Today:     38w 3d                                        EDD:   03/07/17
Best:          36w 5d     Det. By:  LMP  (06/12/16)          EDD:   03/19/17
Anatomy

Cranium:               Appears normal         Aortic Arch:            Not well visualized
Cavum:                 Not well visualized    Ductal Arch:            Not well visualized
Ventricles:            Appears normal         Diaphragm:              Not well visualized
Choroid Plexus:        Not well visualized    Stomach:                Appears normal, left
sided
Cerebellum:            Not well visualized    Abdomen:                Appears normal
Posterior Fossa:       Not well visualized    Abdominal Wall:         Not well visualized
Nuchal Fold:           Not applicable (>20    Cord Vessels:           Appears normal (3
wks GA)                                        vessel cord)
Face:                  Orbits appear nl;      Kidneys:                Appear normal
profile nsw
Lips:                  Not well visualized    Bladder:                Appears normal
Thoracic:              Appears normal         Spine:                  Limited views
appear normal
Heart:                 Appears normal         Upper Extremities:      RT Appears nl;
(4CH, axis, and situs                          hands nsw
RVOT:                  Appears normal         Lower Extremities:      Appears normal;
feet nsw
LVOT:                  Appears normal

Other:  Fetus appears to be a female. Technicallly difficult due to advanced
GA , fetal position, and maternal habitus.
Cervix Uterus Adnexa

Cervix
Not visualized (advanced GA >83wks)
Impression

SIUP at 36+5 weeks
Cephalic presentation
Normal but limited detailed fetal anatomy; no gross
abnormalities identified
Normal amniotic fluid volume
Measurements consistent with LMP dating; EFW at the 89th
%tile
Recommendations

Follow-up as clinically indicated

## 2017-12-31 ENCOUNTER — Ambulatory Visit (INDEPENDENT_AMBULATORY_CARE_PROVIDER_SITE_OTHER): Payer: Medicaid Other

## 2017-12-31 ENCOUNTER — Encounter: Payer: Self-pay | Admitting: Obstetrics & Gynecology

## 2017-12-31 VITALS — BP 118/68 | HR 68

## 2017-12-31 DIAGNOSIS — N912 Amenorrhea, unspecified: Secondary | ICD-10-CM

## 2017-12-31 LAB — POCT PREGNANCY, URINE: Preg Test, Ur: POSITIVE — AB

## 2017-12-31 NOTE — Progress Notes (Signed)
Patient here for pregnancy test.  Test is positive.  LMP 11/28/2017 EDD02/24/2020.  Advised patient to start prenatal vitamins and seek prenatal care.

## 2018-02-11 ENCOUNTER — Encounter: Payer: Medicaid Other | Admitting: Student

## 2018-02-23 ENCOUNTER — Encounter: Payer: Self-pay | Admitting: Advanced Practice Midwife

## 2018-02-23 ENCOUNTER — Ambulatory Visit (INDEPENDENT_AMBULATORY_CARE_PROVIDER_SITE_OTHER): Payer: Self-pay | Admitting: Advanced Practice Midwife

## 2018-02-23 ENCOUNTER — Ambulatory Visit: Payer: Self-pay | Admitting: Clinical

## 2018-02-23 VITALS — BP 102/62 | HR 67 | Wt 151.0 lb

## 2018-02-23 DIAGNOSIS — Z3480 Encounter for supervision of other normal pregnancy, unspecified trimester: Secondary | ICD-10-CM

## 2018-02-23 DIAGNOSIS — Z3481 Encounter for supervision of other normal pregnancy, first trimester: Secondary | ICD-10-CM

## 2018-02-23 DIAGNOSIS — L309 Dermatitis, unspecified: Secondary | ICD-10-CM

## 2018-02-23 LAB — POCT URINALYSIS DIP (DEVICE)
BILIRUBIN URINE: NEGATIVE
GLUCOSE, UA: NEGATIVE mg/dL
Hgb urine dipstick: NEGATIVE
KETONES UR: NEGATIVE mg/dL
LEUKOCYTES UA: NEGATIVE
Nitrite: NEGATIVE
Protein, ur: NEGATIVE mg/dL
SPECIFIC GRAVITY, URINE: 1.02 (ref 1.005–1.030)
Urobilinogen, UA: 0.2 mg/dL (ref 0.0–1.0)
pH: 5.5 (ref 5.0–8.0)

## 2018-02-23 MED ORDER — CLOBETASOL PROPIONATE 0.05 % EX OINT: 1.0000 "application " | TOPICAL_OINTMENT | Freq: Two times a day (BID) | CUTANEOUS | 0 refills | Status: DC

## 2018-02-23 MED ORDER — COMPLETENATE 29-1 MG PO CHEW: 1.0000 | CHEWABLE_TABLET | Freq: Every day | ORAL | 6 refills | Status: DC

## 2018-02-23 NOTE — Patient Instructions (Signed)
First Trimester of Pregnancy The first trimester of pregnancy is from week 1 until the end of week 13 (months 1 through 3). During this time, your baby will begin to develop inside you. At 6-8 weeks, the eyes and face are formed, and the heartbeat can be seen on ultrasound. At the end of 12 weeks, all the baby's organs are formed. Prenatal care is all the medical care you receive before the birth of your baby. Make sure you get good prenatal care and follow all of your doctor's instructions. Follow these instructions at home: Medicines  Take over-the-counter and prescription medicines only as told by your doctor. Some medicines are safe and some medicines are not safe during pregnancy.  Take a prenatal vitamin that contains at least 600 micrograms (mcg) of folic acid.  If you have trouble pooping (constipation), take medicine that will make your stool soft (stool softener) if your doctor approves. Eating and drinking  Eat regular, healthy meals.  Your doctor will tell you the amount of weight gain that is right for you.  Avoid raw meat and uncooked cheese.  If you feel sick to your stomach (nauseous) or throw up (vomit): ? Eat 4 or 5 small meals a day instead of 3 large meals. ? Try eating a few soda crackers. ? Drink liquids between meals instead of during meals.  To prevent constipation: ? Eat foods that are high in fiber, like fresh fruits and vegetables, whole grains, and beans. ? Drink enough fluids to keep your pee (urine) clear or pale yellow. Activity  Exercise only as told by your doctor. Stop exercising if you have cramps or pain in your lower belly (abdomen) or low back.  Do not exercise if it is too hot, too humid, or if you are in a place of great height (high altitude).  Try to avoid standing for long periods of time. Move your legs often if you must stand in one place for a long time.  Avoid heavy lifting.  Wear low-heeled shoes. Sit and stand up straight.  You  can have sex unless your doctor tells you not to. Relieving pain and discomfort  Wear a good support bra if your breasts are sore.  Take warm water baths (sitz baths) to soothe pain or discomfort caused by hemorrhoids. Use hemorrhoid cream if your doctor says it is okay.  Rest with your legs raised if you have leg cramps or low back pain.  If you have puffy, bulging veins (varicose veins) in your legs: ? Wear support hose or compression stockings as told by your doctor. ? Raise (elevate) your feet for 15 minutes, 3-4 times a day. ? Limit salt in your food. Prenatal care  Schedule your prenatal visits by the twelfth week of pregnancy.  Write down your questions. Take them to your prenatal visits.  Keep all your prenatal visits as told by your doctor. This is important. Safety  Wear your seat belt at all times when driving.  Make a list of emergency phone numbers. The list should include numbers for family, friends, the hospital, and police and fire departments. General instructions  Ask your doctor for a referral to a local prenatal class. Begin classes no later than at the start of month 6 of your pregnancy.  Ask for help if you need counseling or if you need help with nutrition. Your doctor can give you advice or tell you where to go for help.  Do not use hot tubs, steam rooms, or   saunas.  Do not douche or use tampons or scented sanitary pads.  Do not cross your legs for long periods of time.  Avoid all herbs and alcohol. Avoid drugs that are not approved by your doctor.  Do not use any tobacco products, including cigarettes, chewing tobacco, and electronic cigarettes. If you need help quitting, ask your doctor. You may get counseling or other support to help you quit.  Avoid cat litter boxes and soil used by cats. These carry germs that can cause birth defects in the baby and can cause a loss of your baby (miscarriage) or stillbirth.  Visit your dentist. At home, brush  your teeth with a soft toothbrush. Be gentle when you floss. Contact a doctor if:  You are dizzy.  You have mild cramps or pressure in your lower belly.  You have a nagging pain in your belly area.  You continue to feel sick to your stomach, you throw up, or you have watery poop (diarrhea).  You have a bad smelling fluid coming from your vagina.  You have pain when you pee (urinate).  You have increased puffiness (swelling) in your face, hands, legs, or ankles. Get help right away if:  You have a fever.  You are leaking fluid from your vagina.  You have spotting or bleeding from your vagina.  You have very bad belly cramping or pain.  You gain or lose weight rapidly.  You throw up blood. It may look like coffee grounds.  You are around people who have German measles, fifth disease, or chickenpox.  You have a very bad headache.  You have shortness of breath.  You have any kind of trauma, such as from a fall or a car accident. Summary  The first trimester of pregnancy is from week 1 until the end of week 13 (months 1 through 3).  To take care of yourself and your unborn baby, you will need to eat healthy meals, take medicines only if your doctor tells you to do so, and do activities that are safe for you and your baby.  Keep all follow-up visits as told by your doctor. This is important as your doctor will have to ensure that your baby is healthy and growing well. This information is not intended to replace advice given to you by your health care provider. Make sure you discuss any questions you have with your health care provider. Document Released: 12/18/2007 Document Revised: 07/09/2016 Document Reviewed: 07/09/2016 Elsevier Interactive Patient Education  2017 [employer]vier Inc[employer]  

## 2018-02-23 NOTE — Progress Notes (Signed)
Subjective:   Melanie DurandFatima Convey is a 37 y.o. [id number] at 3453w3d by LMP being seen today for her first obstetrical visit.  Her obstetrical history is significant for none. Patient does intend to breast feed, has breastfed her other three children without difficulty. Pregnancy history fully reviewed.  Having some nausea, not taking prenatal vitamins because they make nausea worse. Reports last delivery here [place]. Denies any complications with those pregnancies or births.   Is also having some dryness on some of her fingers. She states she washes dishes frequently and notices the dryness gets worse with that. Only on some fingers. Comes/goes. Tried hydrocortisone cream but seems to be getting worse. Itchy.   HISTORY: OB History  Gravida Para Term Preterm AB Living  5 3 3  0 1 3  SAB TAB Ectopic Multiple Live Births  1 0 0 0 3    # Outcome Date GA Lbr Len/2nd Weight Sex Delivery Anes PTL Lv  5 Current           4 Term 2018 6853w0d  8 lb (3.629 kg) F   N LIV  3 Term 01/19/11 2053w0d / 00:10 10 lb (4.536 kg) M Vag-Spont   LIV  2 Term 10/18/07 4274w0d  6 lb 7 oz (2.92 kg) F Vag-Spont EPI N LIV  1 SAB 07/15/06           Last pap smear was  3/18 and was normal History reviewed. No pertinent past medical history. History reviewed. No pertinent surgical history. Family History  Problem Relation Age of Onset  . Diabetes Mother   . Hypertension Mother   . Hypertension Maternal Grandmother   . Heart disease Maternal Grandmother    Social History   Tobacco Use  . Smoking status: Never Smoker  . Smokeless tobacco: Never Used  Substance Use Topics  . Alcohol use: No  . Drug use: No   No Known Allergies No current outpatient medications on file prior to visit.   No current facility-administered medications on file prior to visit.      Exam   Vitals:   02/23/18 0855  BP: 102/62  Pulse: 67  Weight: 151 lb (68.5 kg)   Fetal Heart Rate (bpm):  148  Pelvic Exam:  Deferred, Pap UTD  System: General: well-developed, well-nourished female in no acute distress   Breast:  deferred   Skin: normal coloration and turgor, no rashes   Neurologic: oriented, normal, negative, normal mood   Extremities: normal strength, tone, and muscle mass, ROM of all joints is normal   HEENT extraocular movement intact and sclera clear, anicteric   Mouth/Teeth mucous membranes moist, pharynx normal without lesions and dental hygiene good   Neck supple and no masses   Cardiovascular: regular rate and rhythm   Respiratory:  no respiratory distress, normal breath sounds   Abdomen: soft, non-tender; bowel sounds normal; no masses,  no organomegaly     Assessment:   Pregnancy: [id number] Patient Active Problem List   Diagnosis Date Noted  . Supervision of other normal pregnancy, antepartum 02/23/2018  . Sickle cell trait (HCC) 02/10/2017  . History of anesthesia complications 02/10/2017  . History of macrosomia in infant in prior pregnancy, currently pregnant 02/10/2017     Plan:  1. Supervision of other normal pregnancy, antepartum -- Initial labs drawn including A1C  -- Continue prenatal vitamins. Prescription  -- Genetic Screening discussed, NIPS: ordered. -- Ultrasound discussed; fetal anatomic survey: ordered. -- Problem  list reviewed and updated. --The nature of [place] [facility] with multiple MDs and other Advanced Practice Providers was explained to patient; also emphasized that residents, students are part of our team. -- Routine obstetric precautions reviewed.  2. Eczema Hands -- Counseled on relapsing nature, control of symptoms being goal of treatment -- Encouraged wearing gloves with hand washing -- Encouraged moisturizer every time washing hands -- trial of clobetasol to affected areas, counseled on risks if prolonged use -- will refer to dermatology at follow-up if no improvement   Return in about 4  weeks (around 03/23/2018) for LR OB.   Tamera StandsLaurel S Merina Behrendt, DO

## 2018-02-23 NOTE — BH Specialist Note (Signed)
Integrated Behavioral Health Initial Visit  MRN: [id number] Name: Melanie Peterson  Number of Integrated Behavioral Health Clinician visits:: 1/6 Session Start time: 9:54  Session End time: 9:59 Total time: 5 minutes  Type of Service: Integrated Behavioral Health- Individual/Family Interpretor:No. Interpretor Name and Language: n/a   Warm Hand Off Completed.       SUBJECTIVE: Melanie Peterson is a 37 y.o. female accompanied by n/a Patient was referred by Thressa ShellerHeather Hogan, CNM for initial OB introduction to integrated behavioral health services. Patient reports the following symptoms/concerns: Pt has no particular concerns today. Duration of problem: n/a; Severity of problem: n/a  OBJECTIVE: Mood: Normal and Affect: Appropriate Risk of harm to self or others: No plan to harm self or others  LIFE CONTEXT: Family and Social: - School/Work: - Self-Care: - Life Changes: Current pregnancy  GOALS ADDRESSED: N/a   INTERVENTIONS:  Standardized Assessments completed: GAD-7 and PHQ 9  ASSESSMENT: Patient currently experiencing Supervision of normal pregnancy pregnancy, antepartum   Patient may benefit from initial OB introduction to integrated behavioral health services.  PLAN: 1. Follow up with behavioral health clinician on : As needed 2. Behavioral recommendations: n/a 3. Referral(s): Integrated Behavioral Health Services (In Clinic) 4. "From scale of 1-10, how likely are you to follow plan?": -  Rae LipsJamie C Adalina Dopson, LCSW  Dep[facility] screen [facility] 2/9 02/23/2018 07/17/2017 04/30/2017 03/03/2017 02/17/2017  Decreased Interest 0 0 0 2 0  Down, Depressed, Hopeless 0 0 0 0 0  PHQ - 2 Score 0 0 0 2 0  Altered sleeping 0 0 0 3 0  Tired, decreased energy 1 0 2 1 1   Change in appetite 0 3 1 0 0  Feeling bad or failure about yourself  0 0 0 0 0  Trouble concentrating 0 0 0 0 0  Moving slowly or fidgety/restless 0 0 0 0 0  Suicidal thoughts 0 0 0 0 0  PHQ-9 Score 1 3 3 6 1    GAD 7 :  Generalized Anxiety Score 02/23/2018 04/30/2017 03/03/2017 02/17/2017  Nervous, Anxious, on Edge 0 1 0 0  Control/stop worrying 0 3 0 0  Worry too much - different things 0 2 0 0  Trouble relaxing 0 - 0 0  Restless 0 - 0 0  Easily annoyed or irritable 0 0 0 0  Afraid - awful might happen 0 0 0 0  Total GAD 7 Score 0 - 0 0

## 2018-02-25 LAB — CULTURE, OB URINE

## 2018-02-25 LAB — URINE CULTURE, OB REFLEX: ORGANISM ID, BACTERIA: NO GROWTH

## 2018-03-03 ENCOUNTER — Encounter: Payer: Self-pay | Admitting: *Deleted

## 2018-03-03 LAB — OBSTETRIC PANEL, INCLUDING HIV
ANTIBODY SCREEN: NEGATIVE
BASOS: 0 %
Basophils Absolute: 0 10*3/uL (ref 0.0–0.2)
EOS (ABSOLUTE): 0.1 10*3/uL (ref 0.0–0.4)
Eos: 2 %
HEMATOCRIT: 37.1 % (ref 34.0–46.6)
HEMOGLOBIN: 12.4 g/dL (ref 11.1–15.9)
HEP B S AG: NEGATIVE
HIV Screen 4th Generation wRfx: NONREACTIVE
Immature Grans (Abs): 0 10*3/uL (ref 0.0–0.1)
Immature Granulocytes: 0 %
LYMPHS: 26 %
Lymphocytes Absolute: 1.3 10*3/uL (ref 0.7–3.1)
MCH: 28.2 pg (ref 26.6–33.0)
MCHC: 33.4 g/dL (ref 31.5–35.7)
MCV: 85 fL (ref 79–97)
MONOCYTES: 6 %
Monocytes Absolute: 0.3 10*3/uL (ref 0.1–0.9)
NEUTROS ABS: 3.4 10*3/uL (ref 1.4–7.0)
Neutrophils: 66 %
Platelets: 197 10*3/uL (ref 150–450)
RBC: 4.39 x10E6/uL (ref 3.77–5.28)
RDW: 15.2 % (ref 12.3–15.4)
RPR: NONREACTIVE
RUBELLA: 7.85 {index} (ref >0.99)
Rh Factor: POSITIVE
WBC: 5.1 10*3/uL (ref 3.4–10.8)

## 2018-03-03 LAB — HEMOGLOBIN A1C
Est. average glucose Bld gHb Est-mCnc: 97 mg/dL
HEMOGLOBIN A1C: 5 % (ref 4.8–5.6)

## 2018-03-03 LAB — SMN1 COPY NUMBER ANALYSIS (SMA CARRIER SCREENING)

## 2018-03-23 ENCOUNTER — Encounter: Payer: Self-pay | Admitting: *Deleted

## 2018-03-23 ENCOUNTER — Ambulatory Visit (INDEPENDENT_AMBULATORY_CARE_PROVIDER_SITE_OTHER): Payer: Self-pay | Admitting: Family Medicine

## 2018-03-23 VITALS — BP 107/54 | HR 78 | Wt 154.5 lb

## 2018-03-23 DIAGNOSIS — L309 Dermatitis, unspecified: Secondary | ICD-10-CM

## 2018-03-23 DIAGNOSIS — Z3482 Encounter for supervision of other normal pregnancy, second trimester: Secondary | ICD-10-CM

## 2018-03-23 DIAGNOSIS — Z3480 Encounter for supervision of other normal pregnancy, unspecified trimester: Secondary | ICD-10-CM

## 2018-03-23 DIAGNOSIS — O09899 Supervision of other high risk pregnancies, unspecified trimester: Secondary | ICD-10-CM | POA: Insufficient documentation

## 2018-03-23 NOTE — Patient Instructions (Addendum)
We will call you regarding the steroid ointment for your dry skin.  You will also receive a call to schedule your anatomy U/S.

## 2018-03-23 NOTE — Progress Notes (Signed)
   PRENATAL VISIT NOTE Subjective:  Melanie Peterson is a 37 y.o. [phone] at [redacted]w[redacted]d being seen today for ongoing prenatal care.  She is currently monitored for the following issues for this low-risk pregnancy and has Sickle cell trait (HCC); History of anesthesia complications; History of macrosomia in infant in prior pregnancy, currently pregnant; Supervision of other normal pregnancy, antepartum; and Short interval between pregnancies affecting pregnancy, antepartum on their problem list.  - birth control - didn't like injection, lots of excess bleeding, Nexplanon  - feeding - daughter stopped at 2 months  - lab results - excited about having boy  - skin check clobetasol - declined, pharmacy tried to message, didn't get letter after renewing Medicaid so unsure if she still has it , rash actually getting a little better with using moisturizer  - scheduling U/S anatomy   Patient reports feeling tired.  Contractions: Not present. Vag. Bleeding: None.  Movement: Absent. Denies leaking of fluid.   The following portions of the patient's history were reviewed and updated as appropriate: allergies, current medications, past family history, past medical history, past social history, past surgical history and problem list. Problem list updated.  Objective:   Vitals:   03/23/18 1633  BP: (!) 107/54  Pulse: 78  Weight: 70.1 kg    Fetal Status: Fetal Heart Rate (bpm): 145 Fundal Height: 17 cm Movement: Absent     General:  Alert, oriented and cooperative. Patient is in no acute distress.  Skin: Skin is warm and dry. No rash noted.   Cardiovascular: Normal heart rate noted  Respiratory: Normal respiratory effort, no problems with respiration noted  Abdomen: Soft, gravid, appropriate for gestational age.  Pain/Pressure: Absent     Pelvic: Cervical exam deferred        Extremities: Normal range of motion.  Edema: None  Mental Status: Normal mood and affect. Normal behavior. Normal judgment and  thought content.   Assessment and Plan:  Pregnancy: [id number] at [redacted]w[redacted]d  1. Supervision of Pregnancy - Doing well. Reviewed prenatal record and up-to-date.  -- reviewed lab results, NIPT results -- ordered anatomy U/S, will be called to schedule -- continue PNV  2. Eczema - Stable, slight improvement. Has not picked up prescription because requires prior authorization, and we have not completed paperwork yet. Called pharmacy and they will try to fax again. If unable to get prior-auth, would trial triamcinolone ointment instead.   Preterm labor symptoms and general obstetric precautions including but not limited to vaginal bleeding, contractions, leaking of fluid and fetal movement were reviewed in detail with the patient. Please refer to After Visit Summary for other counseling recommendations.   Future Appointments  Date Time Provider Department Center  04/20/2018  4:15 PM Armando Reichert, CNM [facility]   [place]. Earlene Plater, DO OB/GYN Fellow

## 2018-03-26 ENCOUNTER — Telehealth: Payer: Self-pay

## 2018-03-26 NOTE — Telephone Encounter (Signed)
Called pt to advise her of her upcoming US appointment on 9/27 1:15pm. Left message to call the office to get that information from us.

## 2018-03-27 NOTE — Telephone Encounter (Signed)
Called pt and advised her of her appointment. She verified understanding and had no questions.

## 2018-04-03 ENCOUNTER — Encounter (HOSPITAL_COMMUNITY): Payer: Self-pay

## 2018-04-10 ENCOUNTER — Other Ambulatory Visit: Payer: Self-pay | Admitting: Family Medicine

## 2018-04-10 ENCOUNTER — Ambulatory Visit (HOSPITAL_COMMUNITY)
Admission: RE | Admit: 2018-04-10 | Discharge: 2018-04-10 | Disposition: A | Discharge status: Home / Self Care | Payer: Medicaid Other | Source: Ambulatory Visit | Attending: Family Medicine | Admitting: Family Medicine

## 2018-04-10 DIAGNOSIS — O09292 Supervision of pregnancy with other poor reproductive or obstetric history, second trimester: Secondary | ICD-10-CM | POA: Diagnosis not present

## 2018-04-10 DIAGNOSIS — O09522 Supervision of elderly multigravida, second trimester: Secondary | ICD-10-CM | POA: Diagnosis not present

## 2018-04-10 DIAGNOSIS — Z363 Encounter for antenatal screening for malformations: Secondary | ICD-10-CM

## 2018-04-10 DIAGNOSIS — Z3A19 19 weeks gestation of pregnancy: Secondary | ICD-10-CM | POA: Diagnosis not present

## 2018-04-10 DIAGNOSIS — O09892 Supervision of other high risk pregnancies, second trimester: Secondary | ICD-10-CM

## 2018-04-10 DIAGNOSIS — Z3480 Encounter for supervision of other normal pregnancy, unspecified trimester: Secondary | ICD-10-CM

## 2018-04-20 ENCOUNTER — Ambulatory Visit (INDEPENDENT_AMBULATORY_CARE_PROVIDER_SITE_OTHER): Payer: Medicaid Other | Admitting: Advanced Practice Midwife

## 2018-04-20 ENCOUNTER — Encounter: Payer: Self-pay | Admitting: Family Medicine

## 2018-04-20 DIAGNOSIS — Z3480 Encounter for supervision of other normal pregnancy, unspecified trimester: Secondary | ICD-10-CM

## 2018-04-20 NOTE — Progress Notes (Signed)
   PRENATAL VISIT NOTE  Subjective:  Melanie Peterson is a 37 y.o. [phone] at [redacted]w[redacted]d being seen today for ongoing prenatal care.  She is currently monitored for the following issues for this low-risk pregnancy and has Sickle cell trait (HCC); History of anesthesia complications; History of macrosomia in infant in prior pregnancy, currently pregnant; Supervision of other normal pregnancy, antepartum; and Short interval between pregnancies affecting pregnancy, antepartum on their problem list.  Patient reports no complaints.  Contractions: Not present. Vag. Bleeding: None.  Movement: Present. Denies leaking of fluid.   The following portions of the patient's history were reviewed and updated as appropriate: allergies, current medications, past family history, past medical history, past social history, past surgical history and problem list. Problem list updated.  Objective:   Vitals:   04/20/18 1611  BP: (!) 103/56  Pulse: 80  Weight: 160 lb 11.2 oz (72.9 kg)    Fetal Status: Fetal Heart Rate (bpm): 140 Fundal Height: 19 cm Movement: Present     General:  Alert, oriented and cooperative. Patient is in no acute distress.  Skin: Skin is warm and dry. No rash noted.   Cardiovascular: Normal heart rate noted  Respiratory: Normal respiratory effort, no problems with respiration noted  Abdomen: Soft, gravid, appropriate for gestational age.  Pain/Pressure: Absent     Pelvic: Cervical exam deferred        Extremities: Normal range of motion.  Edema: None  Mental Status: Normal mood and affect. Normal behavior. Normal judgment and thought content.   Assessment and Plan:  Pregnancy: [id number] at [redacted]w[redacted]d  1. Supervision of other normal pregnancy, antepartum - feeling well - no concerns today  - would like to attempt breastfeeding again, but not very successful with first 3 babies - considering nexplanon for birth control; has trouble remembering pills and didn't like depo due to bleeding  Preterm  labor symptoms and general obstetric precautions including but not limited to vaginal bleeding, contractions, leaking of fluid and fetal movement were reviewed in detail with the patient. Please refer to After Visit Summary for other counseling recommendations.  Return in about 4 weeks (around 05/18/2018).  No future appointments.  Gwenevere Abbot, MD

## 2018-04-21 ENCOUNTER — Telehealth: Payer: Self-pay

## 2018-04-21 NOTE — Telephone Encounter (Signed)
Called patient to inform her to start taking baby aspirin for PIH prophylaxis. Patient voice understanding at this time.

## 2018-04-21 NOTE — Telephone Encounter (Signed)
-----   Message from Gwenevere Abbot, MD sent at 04/20/2018  5:09 PM EDT ----- Regarding: Aspirin Needs to start baby aspirin for PIH prophylaxis. Please call patient.

## 2018-05-25 ENCOUNTER — Ambulatory Visit: Payer: Self-pay | Admitting: Clinical

## 2018-05-25 ENCOUNTER — Ambulatory Visit (INDEPENDENT_AMBULATORY_CARE_PROVIDER_SITE_OTHER): Payer: Medicaid Other | Admitting: Family Medicine

## 2018-05-25 DIAGNOSIS — Z3482 Encounter for supervision of other normal pregnancy, second trimester: Secondary | ICD-10-CM

## 2018-05-25 DIAGNOSIS — Z3480 Encounter for supervision of other normal pregnancy, unspecified trimester: Secondary | ICD-10-CM

## 2018-05-25 DIAGNOSIS — Z3A25 25 weeks gestation of pregnancy: Secondary | ICD-10-CM

## 2018-05-25 NOTE — BH Specialist Note (Signed)
Integrated Behavioral Health Initial Visit  MRN: [id number] Name: Melanie Peterson  Number of Integrated Behavioral Health Clinician visits:: 2/6 First intro only Session Start time: 4:17  Session End time: 4:27 Total time: 15 minutes  Type of Service: Integrated Behavioral Health- Individual/Family Interpretor:No. Interpretor Name and Language: n/a   Warm Hand Off Completed.       SUBJECTIVE: Melanie Peterson is a 37 y.o. female accompanied by n/a Patient was referred by Rhett Bannister, DO for emotions Patient reports the following symptoms/concerns: Pt states her primary concern today is feeling exhausted after not sleeping well the past few days, more fatigue than previous pregnancies; denies any other concerns at this time.  Duration of problem: Increase this pregnancy; Severity of problem: mild  OBJECTIVE: Mood: Normal and Affect: Appropriate Risk of harm to self or others: No plan to harm self or others  LIFE CONTEXT: Family and Social: Pt lives with her husband and children (10yo,7yo;1yo) School/Work: Works Armed forces operational officer; school part-time Self-Care: - Life Changes: Current pregnancy  GOALS ADDRESSED: Patient will: 1. Increase knowledge and/or ability of: healthy habits and self-management skills  2. Demonstrate ability to: Increase healthy adjustment to current life circumstances  INTERVENTIONS: Interventions utilized: Psychoeducation and/or Health Education  Standardized Assessments completed: GAD-7 and PHQ 9  ASSESSMENT: Patient currently experiencing Supervision of other normal pregnancy, antepartum   Patient may benefit from psychoeducation and brief therapeutic interventions regarding coping with fatigue. Marland Kitchen  PLAN: 1. Follow up with behavioral health clinician on : As needed 2. Behavioral recommendations:  -Prioritize healthy sleep tonight; consider using sleep app tonight, and repeat nightly for as long as remains helpful -Continue taking prenatal vitamin daily,  as recommended by medical provider 3. Referral(s): Integrated Behavioral Health Services (In Clinic) 4. "From scale of 1-10, how likely are you to follow plan?": -  Rae Lips, LCSW  Depression screen [facility] 2/9 05/25/2018 03/23/2018 02/23/2018 07/17/2017 04/30/2017  Decreased Interest 0 0 0 0 0  Down, Depressed, Hopeless 0 0 0 0 0  PHQ - 2 Score 0 0 0 0 0  Altered sleeping 1 0 0 0 0  Tired, decreased energy 1 1 1  0 2  Change in appetite 0 0 0 3 1  Feeling bad or failure about yourself  0 0 0 0 0  Trouble concentrating 1 0 0 0 0  Moving slowly or fidgety/restless 0 0 0 0 0  Suicidal thoughts 0 0 0 0 0  PHQ-9 Score 3 1 1 3 3    GAD 7 : Generalized Anxiety Score 05/25/2018 03/23/2018 02/23/2018 04/30/2017  Nervous, Anxious, on Edge 1 0 0 1  Control/stop worrying 0 0 0 3  Worry too much - different things 0 0 0 2  Trouble relaxing 0 0 0 -  Restless 0 0 0 -  Easily annoyed or irritable 0 0 0 0  Afraid - awful might happen 0 0 0 0  Total GAD 7 Score 1 0 0 -

## 2018-05-25 NOTE — Progress Notes (Signed)
   PRENATAL VISIT NOTE Subjective:  Melanie Peterson is a 37 y.o. [phone] at [redacted]w[redacted]d being seen today for ongoing prenatal care.  She is currently monitored for the following issues for this low-risk pregnancy and has Sickle cell trait (HCC); History of anesthesia complications; History of macrosomia in infant in prior pregnancy, currently pregnant; Supervision of other normal pregnancy, antepartum; and Short interval between pregnancies affecting pregnancy, antepartum on their problem list.   - eczema much better, only using prn steroid cream on a few spots   Patient reports no complaints. Rash now gone. Contractions: Not present. Vag. Bleeding: None.  Movement: Present. Denies leaking of fluid.   The following portions of the patient's history were reviewed and updated as appropriate: allergies, current medications, past family history, past medical history, past social history, past surgical history and problem list. Problem list updated.  Objective:   Vitals:   05/25/18 1549  BP: 109/66  Pulse: 75  Weight: 161 lb 11.2 oz (73.3 kg)    Fetal Status: Fetal Heart Rate (bpm): 140   Movement: Present     General:  Alert, oriented and cooperative. Patient is in no acute distress.  Skin: Skin is warm and dry. No rash noted.   Cardiovascular: Normal heart rate noted  Respiratory: Normal respiratory effort, no problems with respiration noted  Abdomen: Soft, gravid, appropriate for gestational age.  Pain/Pressure: Absent     Pelvic: Cervical exam deferred        Extremities: Normal range of motion.  Edema: None  Mental Status: Normal mood and affect. Normal behavior. Normal judgment and thought content.   Assessment and Plan:  Pregnancy: [id number] at [redacted]w[redacted]d  1. Supervision of other normal pregnancy, antepartum Prenatal record reviewed and up to date.  - CBC; Future - Glucose Tolerance, 2 Hours w/1 Hour; Future - HIV Antibody (routine testing w rflx); Future - RPR; Future - Tdap vaccine  greater than or equal to 7yo IM; Future - TSH - declined flu shot   Preterm labor symptoms and general obstetric precautions including but not limited to vaginal bleeding, contractions, leaking of fluid and fetal movement were reviewed in detail with the patient.  Please refer to After Visit Summary for other counseling recommendations.  Return in about 3 weeks (around 06/15/2018) for LROB.  Future Appointments  Date Time Provider Department Center  06/15/2018  8:50 AM [facility]  06/15/2018 10:15 AM Armando Reichert, CNM [facility]   Tamera Stands, DO

## 2018-05-25 NOTE — Patient Instructions (Signed)

## 2018-06-15 ENCOUNTER — Ambulatory Visit (INDEPENDENT_AMBULATORY_CARE_PROVIDER_SITE_OTHER): Payer: Medicaid Other | Admitting: Advanced Practice Midwife

## 2018-06-15 ENCOUNTER — Other Ambulatory Visit: Payer: Medicaid Other

## 2018-06-15 ENCOUNTER — Encounter: Payer: Self-pay | Admitting: Advanced Practice Midwife

## 2018-06-15 VITALS — BP 113/54 | HR 77 | Wt 163.3 lb

## 2018-06-15 DIAGNOSIS — Z3482 Encounter for supervision of other normal pregnancy, second trimester: Secondary | ICD-10-CM

## 2018-06-15 DIAGNOSIS — Z3480 Encounter for supervision of other normal pregnancy, unspecified trimester: Secondary | ICD-10-CM | POA: Diagnosis not present

## 2018-06-15 NOTE — Progress Notes (Signed)
   PRENATAL VISIT NOTE  Subjective:  Melanie Peterson is a 37 y.o. [phone] at 592w3d being seen today for ongoing prenatal care.  She is currently monitored for the following issues for this low-risk pregnancy and has Sickle cell trait (HCC); History of anesthesia complications; History of macrosomia in infant in prior pregnancy, currently pregnant; Supervision of other normal pregnancy, antepartum; and Short interval between pregnancies affecting pregnancy, antepartum on their problem list.  Patient reports no complaints.  Contractions: Not present. Vag. Bleeding: None.  Movement: Present. Denies leaking of fluid.   The following portions of the patient's history were reviewed and updated as appropriate: allergies, current medications, past family history, past medical history, past social history, past surgical history and problem list. Problem list updated.  Objective:   Vitals:   06/15/18 1048  BP: (!) 113/54  Pulse: 77  Weight: 163 lb 4.8 oz (74.1 kg)    Fetal Status: Fetal Heart Rate (bpm): 133 Fundal Height: 29 cm Movement: Present     General:  Alert, oriented and cooperative. Patient is in no acute distress.  Skin: Skin is warm and dry. No rash noted.   Cardiovascular: Normal heart rate noted  Respiratory: Normal respiratory effort, no problems with respiration noted  Abdomen: Soft, gravid, appropriate for gestational age.  Pain/Pressure: Absent     Pelvic: Cervical exam deferred        Extremities: Normal range of motion.  Edema: None  Mental Status: Normal mood and affect. Normal behavior. Normal judgment and thought content.   Assessment and Plan:  Pregnancy: [id number] at [redacted]w[redacted]d  1. Supervision of other normal pregnancy, antepartum - Routine care - 2 hour GTT today   Preterm labor symptoms and general obstetric precautions including but not limited to vaginal bleeding, contractions, leaking of fluid and fetal movement were reviewed in detail with the patient. Please refer  to After Visit Summary for other counseling recommendations.  Return in about 2 weeks (around 06/29/2018).  No future appointments.  Thressa ShellerHeather Jaeshaun Riva, CNM

## 2018-06-16 LAB — HIV ANTIBODY (ROUTINE TESTING W REFLEX): HIV Screen 4th Generation wRfx: NONREACTIVE

## 2018-06-16 LAB — CBC
Hematocrit: 36.3 % (ref 34.0–46.6)
Hemoglobin: 12.2 g/dL (ref 11.1–15.9)
MCH: 28.3 pg (ref 26.6–33.0)
MCHC: 33.6 g/dL (ref 31.5–35.7)
MCV: 84 fL (ref 79–97)
Platelets: 115 10*3/uL — ABNORMAL LOW (ref 150–450)
RBC: 4.31 x10E6/uL (ref 3.77–5.28)
RDW: 12.4 % (ref 12.3–15.4)
WBC: 7.1 10*3/uL (ref 3.4–10.8)

## 2018-06-16 LAB — RPR: RPR Ser Ql: NONREACTIVE

## 2018-06-16 LAB — GLUCOSE TOLERANCE, 2 HOURS W/ 1HR
Glucose, 1 hour: 142 mg/dL (ref 65–179)
Glucose, 2 hour: 85 mg/dL (ref 65–152)
Glucose, Fasting: 79 mg/dL (ref 65–91)

## 2018-06-16 LAB — TSH: TSH: 2.61 u[IU]/mL (ref 0.450–4.500)

## 2018-06-18 ENCOUNTER — Encounter: Payer: Self-pay | Admitting: Family Medicine

## 2018-06-18 DIAGNOSIS — D696 Thrombocytopenia, unspecified: Secondary | ICD-10-CM | POA: Insufficient documentation

## 2018-06-18 DIAGNOSIS — O99119 Other diseases of the blood and blood-forming organs and certain disorders involving the immune mechanism complicating pregnancy, unspecified trimester: Secondary | ICD-10-CM

## 2018-06-29 ENCOUNTER — Ambulatory Visit (INDEPENDENT_AMBULATORY_CARE_PROVIDER_SITE_OTHER): Payer: Medicaid Other | Admitting: Advanced Practice Midwife

## 2018-06-29 ENCOUNTER — Encounter: Payer: Self-pay | Admitting: Advanced Practice Midwife

## 2018-06-29 VITALS — BP 110/59 | HR 84 | Wt 169.0 lb

## 2018-06-29 DIAGNOSIS — Z3480 Encounter for supervision of other normal pregnancy, unspecified trimester: Secondary | ICD-10-CM

## 2018-06-29 DIAGNOSIS — D696 Thrombocytopenia, unspecified: Secondary | ICD-10-CM | POA: Insufficient documentation

## 2018-06-29 DIAGNOSIS — O99113 Other diseases of the blood and blood-forming organs and certain disorders involving the immune mechanism complicating pregnancy, third trimester: Secondary | ICD-10-CM

## 2018-06-29 DIAGNOSIS — Z3483 Encounter for supervision of other normal pregnancy, third trimester: Secondary | ICD-10-CM

## 2018-06-29 NOTE — Progress Notes (Signed)
   PRENATAL VISIT NOTE  Subjective:  Melanie Peterson is a 37 y.o. [phone] at 2883w3d being seen today for ongoing prenatal care.  She is currently monitored for the following issues for this low-risk pregnancy and has Sickle cell trait (HCC); History of anesthesia complications; History of macrosomia in infant in prior pregnancy, currently pregnant; Supervision of other normal pregnancy, antepartum; Short interval between pregnancies affecting pregnancy, antepartum; Thrombocytopenia affecting pregnancy (HCC); and Gestational thrombocytopenia, third trimester (HCC) on their problem list.  Patient reports no bleeding, no contractions, no cramping and no leaking.  Contractions: Not present. Vag. Bleeding: None.  Movement: Present. Denies leaking of fluid.   The following portions of the patient's history were reviewed and updated as appropriate: allergies, current medications, past family history, past medical history, past social history, past surgical history and problem list. Problem list updated.  Objective:   Vitals:   06/29/18 1402  BP: (!) 110/59  Pulse: 84  Weight: 169 lb (76.7 kg)    Fetal Status: Fetal Heart Rate (bpm): 140 Fundal Height: 32 cm Movement: Present     General:  Alert, oriented and cooperative. Patient is in no acute distress.  Skin: Skin is warm and dry. No rash noted.   Cardiovascular: Normal heart rate noted  Respiratory: Normal respiratory effort, no problems with respiration noted  Abdomen: Soft, gravid, appropriate for gestational age.  Pain/Pressure: Absent     Pelvic: Cervical exam deferred        Extremities: Normal range of motion.  Edema: None  Mental Status: Normal mood and affect. Normal behavior. Normal judgment and thought content.   Assessment and Plan:  Pregnancy: [id number] at 1483w3d  1. Supervision of other normal pregnancy, antepartum   2. Gestational thrombocytopenia, third trimester [facility](HCC)    -Discussed lab results including decreased platelet  values. -Consult with Dr. Earlene Plateravis who recommends: *Repeat CBC at 34 weeks  *Refer to Hematology for continued decrease. -Patient updated on POC and agrees. No q/c. -Patient husband with questions regarding waterbirth-Verbal Information provided. Patient declines. -Discussed female infant and patient/husband desires circumcision.  -Routine PNC  Preterm labor symptoms and general obstetric precautions including but not limited to vaginal bleeding, contractions, leaking of fluid and fetal movement were reviewed in detail with the patient. Please refer to After Visit Summary for other counseling recommendations.  Return in about 2 weeks (around 07/13/2018).  Future Appointments  Date Time Provider Department Center  07/14/2018 11:15 AM Marvetta GibbonsBurleson, Brand Maleserri L, NP [facility]    Cherre RobinsJessica L Aldric Wenzler, CNM 06/29/2018 2:33 PM

## 2018-06-29 NOTE — Patient Instructions (Signed)

## 2018-07-14 ENCOUNTER — Encounter: Payer: Self-pay | Admitting: Nurse Practitioner

## 2018-07-14 ENCOUNTER — Ambulatory Visit (INDEPENDENT_AMBULATORY_CARE_PROVIDER_SITE_OTHER): Payer: Medicaid Other | Admitting: Nurse Practitioner

## 2018-07-14 VITALS — BP 106/68 | HR 78 | Wt 165.0 lb

## 2018-07-14 DIAGNOSIS — O99113 Other diseases of the blood and blood-forming organs and certain disorders involving the immune mechanism complicating pregnancy, third trimester: Secondary | ICD-10-CM

## 2018-07-14 DIAGNOSIS — Z3480 Encounter for supervision of other normal pregnancy, unspecified trimester: Secondary | ICD-10-CM

## 2018-07-14 DIAGNOSIS — Z3483 Encounter for supervision of other normal pregnancy, third trimester: Secondary | ICD-10-CM

## 2018-07-14 DIAGNOSIS — D696 Thrombocytopenia, unspecified: Secondary | ICD-10-CM

## 2018-07-14 NOTE — Progress Notes (Addendum)
    Subjective:  Melanie DurandFatima Peterson is a 37 y.o. [phone] at 4558w4d being seen today for ongoing prenatal care.  She is currently monitored for the following issues for this low-risk pregnancy and has Sickle cell trait (HCC); History of anesthesia complications; History of macrosomia in infant in prior pregnancy, currently pregnant; Supervision of other normal pregnancy, antepartum; Short interval between pregnancies affecting pregnancy, antepartum; Thrombocytopenia affecting pregnancy (HCC); and Gestational thrombocytopenia, third trimester (HCC) on their problem list.  Patient reports no complaints.  Contractions: Not present. Vag. Bleeding: None.  Movement: Present. Denies leaking of fluid.   The following portions of the patient's history were reviewed and updated as appropriate: allergies, current medications, past family history, past medical history, past social history, past surgical history and problem list. Problem list updated.  Objective:   Vitals:   07/14/18 1136  BP: 106/68  Pulse: 78  Weight: 165 lb (74.8 kg)    Fetal Status: Fetal Heart Rate (bpm): 136 Fundal Height: 35 cm Movement: Present     General:  Alert, oriented and cooperative. Patient is in no acute distress.  Skin: Skin is warm and dry. No rash noted.   Cardiovascular: Normal heart rate noted  Respiratory: Normal respiratory effort, no problems with respiration noted  Abdomen: Soft, gravid, appropriate for gestational age. Pain/Pressure: Absent     Pelvic:  Cervical exam deferred        Extremities: Normal range of motion.  Edema: None  Mental Status: Normal mood and affect. Normal behavior. Normal judgment and thought content.   Urinalysis:      Assessment and Plan:  Pregnancy: [id number] at 5858w4d  1. Supervision of other normal pregnancy, antepartum Watch fundal height and check position of baby carefully - suspect baby is breech as on the anatomy US Discussed contraception and client is thinking she will use  nexplanon again.  2. Gestational thrombocytopenia, third trimester (HCC) Will recheck at next visit.  HGB normal.  Preterm labor symptoms and general obstetric precautions including but not limited to vaginal bleeding, contractions, leaking of fluid and fetal movement were reviewed in detail with the patient. Please refer to After Visit Summary for other counseling recommendations.  Return in about 2 weeks (around 07/28/2018).  Nolene BernheimERRI BURLESON, RN, MSN, NP-BC Nurse Practitioner, [facility], [facility] 07/14/2018 11:54 AM

## 2018-07-15 NOTE — L&D Delivery Note (Signed)
Delivery Note At 12:55 PM a viable female was delivered via Vaginal, Spontaneous (Presentation: vertex; LOA).  APGAR: 9, 9; weight pending.   Placenta status: intact, delivered spontaneously.  Cord:  with no complications.  Cord pH: n/a  Anesthesia:  IV fentanyl Episiotomy: None Lacerations: None Suture Repair: none Est. Blood Loss (mL): 264  Mom to postpartum.  Baby to Couplet care / Skin to Skin.  Standley Brooking 08/25/2018, 1:53 PM

## 2018-07-29 ENCOUNTER — Ambulatory Visit (INDEPENDENT_AMBULATORY_CARE_PROVIDER_SITE_OTHER): Payer: Medicaid Other | Admitting: Nurse Practitioner

## 2018-07-29 VITALS — BP 115/68 | HR 80 | Wt 167.5 lb

## 2018-07-29 DIAGNOSIS — Z3483 Encounter for supervision of other normal pregnancy, third trimester: Secondary | ICD-10-CM | POA: Diagnosis not present

## 2018-07-29 DIAGNOSIS — Z23 Encounter for immunization: Secondary | ICD-10-CM | POA: Diagnosis not present

## 2018-07-29 DIAGNOSIS — Z3480 Encounter for supervision of other normal pregnancy, unspecified trimester: Secondary | ICD-10-CM

## 2018-07-29 DIAGNOSIS — Z3A34 34 weeks gestation of pregnancy: Secondary | ICD-10-CM

## 2018-07-29 DIAGNOSIS — O26843 Uterine size-date discrepancy, third trimester: Secondary | ICD-10-CM

## 2018-07-29 NOTE — Progress Notes (Signed)
    Subjective:  Melanie Peterson is a 38 y.o. [phone] at [redacted]w[redacted]d being seen today for ongoing prenatal care.  She is currently monitored for the following issues for this low-risk pregnancy and has Sickle cell trait (HCC); History of anesthesia complications; History of macrosomia in infant in prior pregnancy, currently pregnant; Supervision of other normal pregnancy, antepartum; Short interval between pregnancies affecting pregnancy, antepartum; Thrombocytopenia affecting pregnancy (HCC); and Gestational thrombocytopenia, third trimester (HCC) on their problem list.  Patient reports no complaints.  Contractions: Not present. Vag. Bleeding: None.  Movement: Present. Denies leaking of fluid.   The following portions of the patient's history were reviewed and updated as appropriate: allergies, current medications, past family history, past medical history, past social history, past surgical history and problem list. Problem list updated.  Objective:   Vitals:   07/29/18 1537  BP: 115/68  Pulse: 80  Weight: 167 lb 8 oz (76 kg)    Fetal Status: Fetal Heart Rate (bpm): 143 Fundal Height: 37 cm Movement: Present     General:  Alert, oriented and cooperative. Patient is in no acute distress.  Skin: Skin is warm and dry. No rash noted.   Cardiovascular: Normal heart rate noted  Respiratory: Normal respiratory effort, no problems with respiration noted  Abdomen: Soft, gravid, appropriate for gestational age. Pain/Pressure: Absent     Pelvic:  Cervical exam deferred        Extremities: Normal range of motion.  Edema: None  Mental Status: Normal mood and affect. Normal behavior. Normal judgment and thought content.   Urinalysis:      Assessment and Plan:  Pregnancy: [id number] at [redacted]w[redacted]d  1. Supervision of other normal pregnancy, antepartum measuring size greater than dates, will order [place] for interval growth and position of baby expect to have vaginal swabs at the next visit  - Tdap vaccine greater  than or equal to 7yo IM  Preterm labor symptoms and general obstetric precautions including but not limited to vaginal bleeding, contractions, leaking of fluid and fetal movement were reviewed in detail with the patient. Please refer to After Visit Summary for other counseling recommendations.  Return in about 2 weeks (around 08/12/2018).  Nolene Bernheim, RN, MSN, NP-BC Nurse Practitioner, [facility] for [employer], [facility] 07/29/2018 4:13 PM

## 2018-08-03 ENCOUNTER — Ambulatory Visit (HOSPITAL_COMMUNITY)
Admission: RE | Admit: 2018-08-03 | Discharge: 2018-08-03 | Disposition: A | Discharge status: Home / Self Care | Payer: Medicaid Other | Source: Ambulatory Visit | Attending: Nurse Practitioner | Admitting: Nurse Practitioner

## 2018-08-03 DIAGNOSIS — O09293 Supervision of pregnancy with other poor reproductive or obstetric history, third trimester: Secondary | ICD-10-CM

## 2018-08-03 DIAGNOSIS — O26843 Uterine size-date discrepancy, third trimester: Secondary | ICD-10-CM | POA: Diagnosis not present

## 2018-08-03 DIAGNOSIS — O09523 Supervision of elderly multigravida, third trimester: Secondary | ICD-10-CM

## 2018-08-03 DIAGNOSIS — Z3A35 35 weeks gestation of pregnancy: Secondary | ICD-10-CM | POA: Diagnosis not present

## 2018-08-03 DIAGNOSIS — O09893 Supervision of other high risk pregnancies, third trimester: Secondary | ICD-10-CM

## 2018-08-12 ENCOUNTER — Encounter: Payer: Self-pay | Admitting: Medical

## 2018-08-12 ENCOUNTER — Other Ambulatory Visit (HOSPITAL_COMMUNITY)
Admission: RE | Admit: 2018-08-12 | Discharge: 2018-08-12 | Disposition: A | Discharge status: Home / Self Care | Payer: Medicaid Other | Source: Ambulatory Visit | Attending: Medical | Admitting: Medical

## 2018-08-12 ENCOUNTER — Ambulatory Visit (INDEPENDENT_AMBULATORY_CARE_PROVIDER_SITE_OTHER): Payer: Medicaid Other | Admitting: Medical

## 2018-08-12 VITALS — BP 108/74 | HR 87 | Wt 170.8 lb

## 2018-08-12 DIAGNOSIS — O99113 Other diseases of the blood and blood-forming organs and certain disorders involving the immune mechanism complicating pregnancy, third trimester: Secondary | ICD-10-CM

## 2018-08-12 DIAGNOSIS — Z3480 Encounter for supervision of other normal pregnancy, unspecified trimester: Secondary | ICD-10-CM | POA: Insufficient documentation

## 2018-08-12 DIAGNOSIS — Z3483 Encounter for supervision of other normal pregnancy, third trimester: Secondary | ICD-10-CM

## 2018-08-12 DIAGNOSIS — D696 Thrombocytopenia, unspecified: Secondary | ICD-10-CM

## 2018-08-12 NOTE — Patient Instructions (Signed)
Fetal Movement Counts Patient Name: ________________________________________________ Patient Due Date: ____________________ What is a fetal movement count?  A fetal movement count is the number of times that you feel your baby move during a certain amount of time. This may also be called a fetal kick count. A fetal movement count is recommended for every pregnant woman. You may be asked to start counting fetal movements as early as week 28 of your pregnancy. Pay attention to when your baby is most active. You may notice your baby's sleep and wake cycles. You may also notice things that make your baby move more. You should do a fetal movement count:  When your baby is normally most active.  At the same time each day. A good time to count movements is while you are resting, after having something to eat and drink. How do I count fetal movements? 1. Find a quiet, comfortable area. Sit, or lie down on your side. 2. Write down the date, the start time and stop time, and the number of movements that you felt between those two times. Take this information with you to your health care visits. 3. For 2 hours, count kicks, flutters, swishes, rolls, and jabs. You should feel at least 10 movements during 2 hours. 4. You may stop counting after you have felt 10 movements. 5. If you do not feel 10 movements in 2 hours, have something to eat and drink. Then, keep resting and counting for 1 hour. If you feel at least 4 movements during that hour, you may stop counting. Contact a health care provider if:  You feel fewer than 4 movements in 2 hours.  Your baby is not moving like he or she usually does. Date: ____________ Start time: ____________ Stop time: ____________ Movements: ____________ Date: ____________ Start time: ____________ Stop time: ____________ Movements: ____________ Date: ____________ Start time: ____________ Stop time: ____________ Movements: ____________ Date: ____________ Start time:  ____________ Stop time: ____________ Movements: ____________ Date: ____________ Start time: ____________ Stop time: ____________ Movements: ____________ Date: ____________ Start time: ____________ Stop time: ____________ Movements: ____________ Date: ____________ Start time: ____________ Stop time: ____________ Movements: ____________ Date: ____________ Start time: ____________ Stop time: ____________ Movements: ____________ Date: ____________ Start time: ____________ Stop time: ____________ Movements: ____________ This information is not intended to replace advice given to you by your health care provider. Make sure you discuss any questions you have with your health care provider. Document Released: 07/31/2006 Document Revised: 02/28/2016 Document Reviewed: 08/10/2015 Elsevier Interactive Patient Education  2019 [employer] Braxton Hicks Contractions Contractions of the uterus can occur throughout pregnancy, but they are not always a sign that you are in labor. You may have practice contractions called Braxton Hicks contractions. These false labor contractions are sometimes confused with true labor. What are Braxton Hicks contractions? Braxton Hicks contractions are tightening movements that occur in the muscles of the uterus before labor. Unlike true labor contractions, these contractions do not result in opening (dilation) and thinning of the cervix. Toward the end of pregnancy (32-34 weeks), Braxton Hicks contractions can happen more often and may become stronger. These contractions are sometimes difficult to tell apart from true labor because they can be very uncomfortable. You should not feel embarrassed if you go to the hospital with false labor. Sometimes, the only way to tell if you are in true labor is for your health care provider to look for changes in the cervix. The health care provider will do a physical exam and may monitor your contractions. If   you are not in true labor, the exam  should show that your cervix is not dilating and your water has not broken. If there are no other health problems associated with your pregnancy, it is completely safe for you to be sent home with false labor. You may continue to have Braxton Hicks contractions until you go into true labor. How to tell the difference between true labor and false labor True labor  Contractions last 30-70 seconds.  Contractions become very regular.  Discomfort is usually felt in the top of the uterus, and it spreads to the lower abdomen and low back.  Contractions do not go away with walking.  Contractions usually become more intense and increase in frequency.  The cervix dilates and gets thinner. False labor  Contractions are usually shorter and not as strong as true labor contractions.  Contractions are usually irregular.  Contractions are often felt in the front of the lower abdomen and in the groin.  Contractions may go away when you walk around or change positions while lying down.  Contractions get weaker and are shorter-lasting as time goes on.  The cervix usually does not dilate or become thin. Follow these instructions at home:   Take over-the-counter and prescription medicines only as told by your health care provider.  Keep up with your usual exercises and follow other instructions from your health care provider.  Eat and drink lightly if you think you are going into labor.  If Braxton Hicks contractions are making you uncomfortable: ? Change your position from lying down or resting to walking, or change from walking to resting. ? Sit and rest in a tub of warm water. ? Drink enough fluid to keep your urine pale yellow. Dehydration may cause these contractions. ? Do slow and deep breathing several times an hour.  Keep all follow-up prenatal visits as told by your health care provider. This is important. Contact a health care provider if:  You have a fever.  You have continuous  pain in your abdomen. Get help right away if:  Your contractions become stronger, more regular, and closer together.  You have fluid leaking or gushing from your vagina.  You pass blood-tinged mucus (bloody show).  You have bleeding from your vagina.  You have low back pain that you never had before.  You feel your baby's head pushing down and causing pelvic pressure.  Your baby is not moving inside you as much as it used to. Summary  Contractions that occur before labor are called Braxton Hicks contractions, false labor, or practice contractions.  Braxton Hicks contractions are usually shorter, weaker, farther apart, and less regular than true labor contractions. True labor contractions usually become progressively stronger and regular, and they become more frequent.  Manage discomfort from Braxton Hicks contractions by changing position, resting in a warm bath, drinking plenty of water, or practicing deep breathing. This information is not intended to replace advice given to you by your health care provider. Make sure you discuss any questions you have with your health care provider. Document Released: 11/14/2016 Document Revised: 04/15/2017 Document Reviewed: 11/14/2016 Elsevier Interactive Patient Education  2019 Elsevier Inc.  

## 2018-08-12 NOTE — Progress Notes (Signed)
   PRENATAL VISIT NOTE  Subjective:  Melanie Peterson is a 38 y.o. [phone] at [redacted]w[redacted]d being seen today for ongoing prenatal care.  She is currently monitored for the following issues for this high-risk pregnancy and has Sickle cell trait (HCC); History of anesthesia complications; History of macrosomia in infant in prior pregnancy, currently pregnant; Supervision of other normal pregnancy, antepartum; Short interval between pregnancies affecting pregnancy, antepartum; Thrombocytopenia affecting pregnancy (HCC); and Gestational thrombocytopenia, third trimester (HCC) on their problem list.  Patient reports no complaints.  Contractions: Irritability. Vag. Bleeding: None.  Movement: Present. Denies leaking of fluid.   The following portions of the patient's history were reviewed and updated as appropriate: allergies, current medications, past family history, past medical history, past social history, past surgical history and problem list. Problem list updated.  Objective:   Vitals:   08/12/18 1605  BP: 108/74  Pulse: 87  Weight: 170 lb 12.8 oz (77.5 kg)    Fetal Status: Fetal Heart Rate (bpm): 131 Fundal Height: 38 cm Movement: Present  Presentation: Vertex  General:  Alert, oriented and cooperative. Patient is in no acute distress.  Skin: Skin is warm and dry. No rash noted.   Cardiovascular: Normal heart rate noted  Respiratory: Normal respiratory effort, no problems with respiration noted  Abdomen: Soft, gravid, appropriate for gestational age.  Pain/Pressure: Absent     Pelvic: Cervical exam performed Dilation: 1.5 Effacement (%): 20 Station: -3  Extremities: Normal range of motion.  Edema: None  Mental Status: Normal mood and affect. Normal behavior. Normal judgment and thought content.   Assessment and Plan:  Pregnancy: G1W2993 at [redacted]w[redacted]d  1. Supervision of other normal pregnancy, antepartum - GC/Chlamydia probe amp ([place])not at [facility] - Culture, beta strep (group b only)  2.  Gestational thrombocytopenia, third trimester (HCC) - CBC  Preterm labor symptoms and general obstetric precautions including but not limited to vaginal bleeding, contractions, leaking of fluid and fetal movement were reviewed in detail with the patient. Please refer to After Visit Summary for other counseling recommendations.  Return in about 1 week (around 08/19/2018) for LOB.   Vonzella Nipple, PA-C

## 2018-08-13 LAB — CBC
Hematocrit: 35.6 % (ref 34.0–46.6)
Hemoglobin: 12.1 g/dL (ref 11.1–15.9)
MCH: 27.8 pg (ref 26.6–33.0)
MCHC: 34 g/dL (ref 31.5–35.7)
MCV: 82 fL (ref 79–97)
Platelets: 126 10*3/uL — ABNORMAL LOW (ref 150–450)
RBC: 4.36 x10E6/uL (ref 3.77–5.28)
RDW: 13.5 % (ref 11.7–15.4)
WBC: 5.9 10*3/uL (ref 3.4–10.8)

## 2018-08-13 LAB — GC/CHLAMYDIA PROBE AMP (~~LOC~~) NOT AT ARMC
Chlamydia: NEGATIVE
NEISSERIA GONORRHEA: NEGATIVE

## 2018-08-16 LAB — CULTURE, BETA STREP (GROUP B ONLY): Strep Gp B Culture: NEGATIVE

## 2018-08-19 ENCOUNTER — Ambulatory Visit (INDEPENDENT_AMBULATORY_CARE_PROVIDER_SITE_OTHER): Payer: Medicaid Other | Admitting: Obstetrics & Gynecology

## 2018-08-19 VITALS — BP 115/73 | HR 75 | Wt 172.0 lb

## 2018-08-19 DIAGNOSIS — Z3480 Encounter for supervision of other normal pregnancy, unspecified trimester: Secondary | ICD-10-CM

## 2018-08-19 DIAGNOSIS — D573 Sickle-cell trait: Secondary | ICD-10-CM

## 2018-08-19 DIAGNOSIS — D696 Thrombocytopenia, unspecified: Secondary | ICD-10-CM

## 2018-08-19 DIAGNOSIS — Z3483 Encounter for supervision of other normal pregnancy, third trimester: Secondary | ICD-10-CM

## 2018-08-19 DIAGNOSIS — O99113 Other diseases of the blood and blood-forming organs and certain disorders involving the immune mechanism complicating pregnancy, third trimester: Secondary | ICD-10-CM

## 2018-08-19 NOTE — Progress Notes (Signed)
   PRENATAL VISIT NOTE  Subjective:  Melanie Peterson is a 38 y.o. [phone] at [redacted]w[redacted]d being seen today for ongoing prenatal care.  She is currently monitored for the following issues for this high-risk pregnancy and has Sickle cell trait (HCC); History of anesthesia complications; History of macrosomia in infant in prior pregnancy, currently pregnant; Supervision of other normal pregnancy, antepartum; Short interval between pregnancies affecting pregnancy, antepartum; Thrombocytopenia affecting pregnancy (HCC); and Gestational thrombocytopenia, third trimester (HCC) on their problem list.  Patient reports no complaints.  Contractions: Irritability. Vag. Bleeding: None.  Movement: Present. Denies leaking of fluid.   The following portions of the patient's history were reviewed and updated as appropriate: allergies, current medications, past family history, past medical history, past social history, past surgical history and problem list. Problem list updated.  Objective:   Vitals:   08/19/18 1638  BP: 115/73  Pulse: 75  Weight: 172 lb (78 kg)    Fetal Status: Fetal Heart Rate (bpm): 156   Movement: Present     General:  Alert, oriented and cooperative. Patient is in no acute distress.  Skin: Skin is warm and dry. No rash noted.   Cardiovascular: Normal heart rate noted  Respiratory: Normal respiratory effort, no problems with respiration noted  Abdomen: Soft, gravid, appropriate for gestational age.  Pain/Pressure: Present     Pelvic: Cervical exam deferred        Extremities: Normal range of motion.  Edema: None  Mental Status: Normal mood and affect. Normal behavior. Normal judgment and thought content.   Assessment and Plan:  Pregnancy: [id number] at [redacted]w[redacted]d  1. Supervision of other normal pregnancy, antepartum   2. Gestational thrombocytopenia, third trimester (HCC)   3. Sickle cell trait (HCC)   Preterm labor symptoms and general obstetric precautions including but not limited to  vaginal bleeding, contractions, leaking of fluid and fetal movement were reviewed in detail with the patient. Please refer to After Visit Summary for other counseling recommendations.  Return in about 1 week (around 08/26/2018).  Future Appointments  Date Time Provider Department Center  08/26/2018  3:55 PM Kathlene Cote [facility]  09/02/2018  3:35 PM Magnus Sinning, [place], PA-C [facility]    Allie Bossier, MD

## 2018-08-25 ENCOUNTER — Inpatient Hospital Stay (HOSPITAL_COMMUNITY)
Admission: AD | Admit: 2018-08-25 | Discharge: 2018-08-27 | DRG: 806 | Disposition: A | Discharge status: Home / Self Care | Payer: Medicaid Other | Attending: Family Medicine | Admitting: Family Medicine

## 2018-08-25 ENCOUNTER — Encounter (HOSPITAL_COMMUNITY): Payer: Self-pay | Admitting: *Deleted

## 2018-08-25 ENCOUNTER — Other Ambulatory Visit: Payer: Self-pay

## 2018-08-25 DIAGNOSIS — Z3A38 38 weeks gestation of pregnancy: Secondary | ICD-10-CM | POA: Diagnosis not present

## 2018-08-25 DIAGNOSIS — O9912 Other diseases of the blood and blood-forming organs and certain disorders involving the immune mechanism complicating childbirth: Secondary | ICD-10-CM | POA: Diagnosis not present

## 2018-08-25 DIAGNOSIS — O99119 Other diseases of the blood and blood-forming organs and certain disorders involving the immune mechanism complicating pregnancy, unspecified trimester: Secondary | ICD-10-CM | POA: Diagnosis present

## 2018-08-25 DIAGNOSIS — Z3483 Encounter for supervision of other normal pregnancy, third trimester: Secondary | ICD-10-CM | POA: Diagnosis present

## 2018-08-25 DIAGNOSIS — O09899 Supervision of other high risk pregnancies, unspecified trimester: Secondary | ICD-10-CM

## 2018-08-25 DIAGNOSIS — O9902 Anemia complicating childbirth: Secondary | ICD-10-CM | POA: Diagnosis present

## 2018-08-25 DIAGNOSIS — D573 Sickle-cell trait: Secondary | ICD-10-CM | POA: Diagnosis present

## 2018-08-25 DIAGNOSIS — D6959 Other secondary thrombocytopenia: Secondary | ICD-10-CM | POA: Diagnosis present

## 2018-08-25 DIAGNOSIS — Z3480 Encounter for supervision of other normal pregnancy, unspecified trimester: Secondary | ICD-10-CM

## 2018-08-25 DIAGNOSIS — D696 Thrombocytopenia, unspecified: Secondary | ICD-10-CM | POA: Diagnosis present

## 2018-08-25 DIAGNOSIS — O09299 Supervision of pregnancy with other poor reproductive or obstetric history, unspecified trimester: Secondary | ICD-10-CM

## 2018-08-25 LAB — CBC
HCT: 38.5 % (ref 36.0–46.0)
HCT: 34 % — ABNORMAL LOW (ref 36.0–46.0)
HEMOGLOBIN: 12.6 g/dL (ref 12.0–15.0)
Hemoglobin: 11 g/dL — ABNORMAL LOW (ref 12.0–15.0)
MCH: 26.9 pg (ref 26.0–34.0)
MCH: 27 pg (ref 26.0–34.0)
MCHC: 32.7 g/dL (ref 30.0–36.0)
MCHC: 32.4 g/dL (ref 30.0–36.0)
MCV: 82.3 fL (ref 80.0–100.0)
MCV: 83.3 fL (ref 80.0–100.0)
NRBC: 0 % (ref 0.0–0.2)
Platelets: 120 10*3/uL — ABNORMAL LOW (ref 150–400)
Platelets: 143 10*3/uL — ABNORMAL LOW (ref 150–400)
RBC: 4.68 MIL/uL (ref 3.87–5.11)
RBC: 4.08 MIL/uL (ref 3.87–5.11)
RDW: 14 % (ref 11.5–15.5)
RDW: 14 % (ref 11.5–15.5)
WBC: 6.4 10*3/uL (ref 4.0–10.5)
WBC: 15.4 10*3/uL — ABNORMAL HIGH (ref 4.0–10.5)
nRBC: 0 % (ref 0.0–0.2)

## 2018-08-25 LAB — TYPE AND SCREEN
ABO/RH(D): O POS
Antibody Screen: NEGATIVE

## 2018-08-25 MED ORDER — SENNOSIDES-DOCUSATE SODIUM 8.6-50 MG PO TABS
2.0000 | ORAL_TABLET | ORAL | Status: DC
  Administered 2018-08-25 – 2018-08-26 (×2): 2 via ORAL
  Filled 2018-08-25 (×2): qty 2

## 2018-08-25 MED ORDER — LACTATED RINGERS IV SOLN: 500.0000 mL | INTRAVENOUS | Status: DC | PRN

## 2018-08-25 MED ORDER — LIDOCAINE HCL (PF) 1 % IJ SOLN
INTRAMUSCULAR | Status: AC
  Filled 2018-08-25: qty 30

## 2018-08-25 MED ORDER — DIBUCAINE 1 % RE OINT: 1.0000 "application " | TOPICAL_OINTMENT | RECTAL | Status: DC | PRN

## 2018-08-25 MED ORDER — LIDOCAINE HCL (PF) 1 % IJ SOLN
30.0000 mL | INTRAMUSCULAR | Status: DC | PRN
  Filled 2018-08-25: qty 30

## 2018-08-25 MED ORDER — BENZOCAINE-MENTHOL 20-0.5 % EX AERO: 1.0000 "application " | INHALATION_SPRAY | CUTANEOUS | Status: DC | PRN

## 2018-08-25 MED ORDER — LACTATED RINGERS IV SOLN
INTRAVENOUS | Status: DC
  Administered 2018-08-25: 12:00:00 via INTRAVENOUS

## 2018-08-25 MED ORDER — ONDANSETRON HCL 4 MG PO TABS: 4.0000 mg | ORAL_TABLET | ORAL | Status: DC | PRN

## 2018-08-25 MED ORDER — MISOPROSTOL 200 MCG PO TABS
ORAL_TABLET | ORAL | Status: AC
  Filled 2018-08-25: qty 5

## 2018-08-25 MED ORDER — ZOLPIDEM TARTRATE 5 MG PO TABS: 5.0000 mg | ORAL_TABLET | Freq: Every evening | ORAL | Status: DC | PRN

## 2018-08-25 MED ORDER — OXYCODONE-ACETAMINOPHEN 5-325 MG PO TABS
1.0000 | ORAL_TABLET | ORAL | Status: DC | PRN
  Administered 2018-08-25: 1 via ORAL
  Filled 2018-08-25: qty 1

## 2018-08-25 MED ORDER — ONDANSETRON HCL 4 MG/2ML IJ SOLN: 4.0000 mg | INTRAMUSCULAR | Status: DC | PRN

## 2018-08-25 MED ORDER — SOD CITRATE-CITRIC ACID 500-334 MG/5ML PO SOLN: 30.0000 mL | ORAL | Status: DC | PRN

## 2018-08-25 MED ORDER — OXYCODONE-ACETAMINOPHEN 5-325 MG PO TABS: 2.0000 | ORAL_TABLET | ORAL | Status: DC | PRN

## 2018-08-25 MED ORDER — LACTATED RINGERS IV SOLN
INTRAVENOUS | Status: DC
  Administered 2018-08-25 – 2018-08-26 (×2): via INTRAVENOUS

## 2018-08-25 MED ORDER — OXYTOCIN BOLUS FROM INFUSION
500.0000 mL | Freq: Once | INTRAVENOUS | Status: AC
  Administered 2018-08-25: 500 mL via INTRAVENOUS

## 2018-08-25 MED ORDER — ACETAMINOPHEN 325 MG PO TABS: 650.0000 mg | ORAL_TABLET | ORAL | Status: DC | PRN

## 2018-08-25 MED ORDER — METHYLERGONOVINE MALEATE 0.2 MG/ML IJ SOLN: 0.2000 mg | Freq: Once | INTRAMUSCULAR | Status: AC

## 2018-08-25 MED ORDER — OXYTOCIN 40 UNITS IN NORMAL SALINE INFUSION - SIMPLE MED
INTRAVENOUS | Status: AC
  Filled 2018-08-25: qty 1000

## 2018-08-25 MED ORDER — FENTANYL CITRATE (PF) 100 MCG/2ML IJ SOLN
50.0000 ug | INTRAMUSCULAR | Status: DC | PRN
  Administered 2018-08-25: 100 ug via INTRAVENOUS
  Filled 2018-08-25: qty 2

## 2018-08-25 MED ORDER — COCONUT OIL OIL: 1.0000 "application " | TOPICAL_OIL | Status: DC | PRN

## 2018-08-25 MED ORDER — METHYLERGONOVINE MALEATE 0.2 MG PO TABS
0.2000 mg | ORAL_TABLET | Freq: Four times a day (QID) | ORAL | Status: DC
  Administered 2018-08-25 – 2018-08-26 (×3): 0.2 mg via ORAL
  Filled 2018-08-25 (×3): qty 1

## 2018-08-25 MED ORDER — IBUPROFEN 600 MG PO TABS
600.0000 mg | ORAL_TABLET | Freq: Four times a day (QID) | ORAL | Status: DC
  Administered 2018-08-25 – 2018-08-27 (×7): 600 mg via ORAL
  Filled 2018-08-25 (×7): qty 1

## 2018-08-25 MED ORDER — PRENATAL MULTIVITAMIN CH
1.0000 | ORAL_TABLET | Freq: Every day | ORAL | Status: DC
  Administered 2018-08-26: 1 via ORAL
  Filled 2018-08-25: qty 1

## 2018-08-25 MED ORDER — METHYLERGONOVINE MALEATE 0.2 MG/ML IJ SOLN
INTRAMUSCULAR | Status: AC
  Administered 2018-08-25: 18:00:00
  Filled 2018-08-25: qty 1

## 2018-08-25 MED ORDER — ONDANSETRON HCL 4 MG/2ML IJ SOLN
4.0000 mg | Freq: Four times a day (QID) | INTRAMUSCULAR | Status: DC | PRN
  Administered 2018-08-25: 4 mg via INTRAVENOUS
  Filled 2018-08-25: qty 2

## 2018-08-25 MED ORDER — ACETAMINOPHEN 325 MG PO TABS
650.0000 mg | ORAL_TABLET | ORAL | Status: DC | PRN
  Administered 2018-08-25 – 2018-08-26 (×2): 650 mg via ORAL
  Filled 2018-08-25 (×4): qty 2

## 2018-08-25 MED ORDER — WITCH HAZEL-GLYCERIN EX PADS: 1.0000 "application " | MEDICATED_PAD | CUTANEOUS | Status: DC | PRN

## 2018-08-25 MED ORDER — LACTATED RINGERS IV BOLUS
1000.0000 mL | Freq: Once | INTRAVENOUS | Status: AC
  Administered 2018-08-25: 1000 mL via INTRAVENOUS

## 2018-08-25 MED ORDER — OXYTOCIN 40 UNITS IN NORMAL SALINE INFUSION - SIMPLE MED: 2.5000 [IU]/h | INTRAVENOUS | Status: DC

## 2018-08-25 MED ORDER — OXYTOCIN 40 UNITS IN NORMAL SALINE INFUSION - SIMPLE MED
INTRAVENOUS | Status: AC
  Administered 2018-08-25: 18:00:00
  Filled 2018-08-25: qty 1000

## 2018-08-25 MED ORDER — SIMETHICONE 80 MG PO CHEW: 80.0000 mg | CHEWABLE_TABLET | ORAL | Status: DC | PRN

## 2018-08-25 MED ORDER — TETANUS-DIPHTH-ACELL PERTUSSIS 5-2.5-18.5 LF-MCG/0.5 IM SUSP: 0.5000 mL | Freq: Once | INTRAMUSCULAR | Status: DC

## 2018-08-25 MED ORDER — DIPHENHYDRAMINE HCL 25 MG PO CAPS: 25.0000 mg | ORAL_CAPSULE | Freq: Four times a day (QID) | ORAL | Status: DC | PRN

## 2018-08-25 MED ORDER — OXYCODONE HCL 5 MG PO TABS: 5.0000 mg | ORAL_TABLET | ORAL | Status: DC | PRN

## 2018-08-25 NOTE — H&P (Addendum)
LABOR AND DELIVERY ADMISSION HISTORY AND PHYSICAL NOTE  Melanie Peterson is a 38 y.o. female [phone] with IUP at [redacted]w[redacted]d by LMP presenting for SOL.  She reports positive fetal movement. She denies leakage of fluid or vaginal bleeding.  Prenatal History/Complications: PNC at [facility] Pregnancy complications:  - Gestational Thrombocytopenia - Hx of Macrosomia - Sickle Cell Trait - Hx of Anesthesia complications  Past Medical History: History reviewed. No pertinent past medical history.  Past Surgical History: History reviewed. No pertinent surgical history.  Obstetrical History: OB History    Gravida  5   Para  3   Term  3   Preterm  0   AB  1   Living  3     SAB  1   TAB  0   Ectopic  0   Multiple  0   Live Births  3           Social History: Social History   Socioeconomic History  . Marital status: Married    Spouse name: Not on file  . Number of children: Not on file  . Years of education: Not on file  . Highest education level: Not on file  Occupational History  . Not on file  Social Needs  . Financial resource strain: Not on file  . Food insecurity:    Worry: Not on file    Inability: Not on file  . Transportation needs:    Medical: Not on file    Non-medical: Not on file  Tobacco Use  . Smoking status: Never Smoker  . Smokeless tobacco: Never Used  Substance and Sexual Activity  . Alcohol use: No  . Drug use: No  . Sexual activity: Yes  Lifestyle  . Physical activity:    Days per week: Not on file    Minutes per session: Not on file  . Stress: Not on file  Relationships  . Social connections:    Talks on phone: Not on file    Gets together: Not on file    Attends religious service: Not on file    Active member of club or organization: Not on file    Attends meetings of clubs or organizations: Not on file    Relationship status: Not on file  Other Topics Concern  . Not on file  Social History Narrative  . Not on file    Family  History: Family History  Problem Relation Age of Onset  . Diabetes Mother   . Hypertension Mother   . Hypertension Maternal Grandmother   . Heart disease Maternal Grandmother     Allergies: No Known Allergies  Medications Prior to Admission  Medication Sig Dispense Refill Last Dose  . prenatal vitamin w/FE, FA (NATACHEW) 29-1 MG CHEW chewable tablet Chew 1 tablet by mouth daily. 30 tablet 6 08/24/2018 at Unknown time     Review of Systems  All systems reviewed and negative except as stated in HPI  Physical Exam Blood pressure 130/86, pulse 97, temperature 99.1 F (37.3 C), resp. rate 20, last menstrual period 11/28/2017, currently breastfeeding. General appearance: alert, oriented, having pain with contractions Lungs: normal respiratory effort Heart: regular rate Abdomen: soft, non-tender; gravid, FH appropriate for GA Extremities: No calf swelling or tenderness Presentation: cephalic by prior exam Fetal monitoring: 125bpm baseline, moderate variability, 15x15 accels, no decels Uterine activity: ctx q 2-13min Dilation: 6 Effacement (%): 100 Station: 0 Exam by:: jolynn  Prenatal labs: ABO, Rh: O/Positive/-- (08/12 0233) Antibody: Negative (08/12 0949) Rubella:  7.85 (08/12 0949) RPR: Non Reactive (12/02 0845)  HBsAg: Negative (08/12 0949)  HIV: Non Reactive (12/02 0845)  GC/Chlamydia: neg/neg GBS:   negative 2-hr GTT: 79, 142, 85 Genetic screening:  negative Anatomy US: normal  Prenatal Transfer Tool  Maternal Diabetes: No Genetic Screening: Normal Maternal Ultrasounds/Referrals: Normal Fetal Ultrasounds or other Referrals:  None Maternal Substance Abuse:  No Significant Maternal Medications:  None Significant Maternal Lab Results: Lab values include: Group B Strep negative  No results found for this or any previous visit (from the past 24 hour(s)).  Patient Active Problem List   Diagnosis Date Noted  . Normal labor 08/25/2018  . Gestational  thrombocytopenia, third trimester (HCC) 06/29/2018  . Thrombocytopenia affecting pregnancy (HCC) 06/18/2018  . Short interval between pregnancies affecting pregnancy, antepartum 03/23/2018  . Supervision of other normal pregnancy, antepartum 02/23/2018  . Sickle cell trait (HCC) 02/10/2017  . History of anesthesia complications 02/10/2017  . History of macrosomia in infant in prior pregnancy, currently pregnant 02/10/2017    Assessment: Melanie Peterson is a 38 y.o. [id number] at [redacted]w[redacted]d here for SOL  #Labor: active, patient progressing well, will start on IV fentanyl then proceed to AROM #Pain: IV fentanyl, does not want epidrual #FWB: Cat 1 #ID:  GBS negative #MOF: breast #MOC: undecided #Circ:  Yes, outpatient  Standley Brooking 08/25/2018, 11:58 AM   CNM attestation:  I have seen and examined this patient; I agree with above documentation in the resident's note.   Melanie Peterson is a 38 y.o. [id number] here for SOL- active phase.  PE: BP 115/66   Pulse 70   Temp 98.2 F (36.8 C) (Oral)   Resp 18   Ht 5\' 4"  (1.626 m)   Wt 79 kg   LMP 11/28/2017   Breastfeeding Unknown   BMI 29.90 kg/m   Resp: normal effort, no distress Abd: gravid  ROS, labs, PMH reviewed  Plts: 120,000 on admission  Plan: Admit to Labor and Delivery Expectant management Anticipate SVD  Melanie Peterson CNM 08/25/2018, 2:32 PM

## 2018-08-25 NOTE — MAU Note (Signed)
Pt brought from lobby via wc.  Contractions started this morning, getting closer and stronger.no bleeding or leaking.

## 2018-08-25 NOTE — Progress Notes (Signed)
During fundal checks after hemorrhage, patient continues to trickle slightly occasionally and trickling subsides after massage. Fundus is still firm and U/1. Notified Dr. Gevena Cotton of event and trickling. Dr. Gevena Cotton to check on patient. Earl Gala, Linda Hedges [place]

## 2018-08-25 NOTE — Progress Notes (Signed)
Upon admission to mother baby unit, patient trickled slightly with initial assessment, however, was firm U/1 with no clots. Trickling subsided with massage. Reported by L&D RN that patient had just ambulated and voided prior to transfer. Upon one hour check, patient had larger amount of lochia and offered assistance to bathroom to void. Some blood trickled to floor and patient voided large amount. Patient trickled more blood in toilet (moderate amount). Upon getting back to bed, bleeding was small, no trickling or clots and uterus was firm, U/1. Instructed patient to call RN immediately if she felt that she had clots or increased bleeding. Received a call while in another room that patient felt like she was bleeding. This RN immediately went to room. Patient was sitting on side of bed attempted to get up to the bathroom. Patient's significant other, along with this RN, stood by patient to ambulate to bathroom. Before sitting on toilet, patient passed many large clots and large amount of blood on floor. Patient stated she felt lightheaded and leaned on her significant other. Patient and significant other eased down to the floor so patient could rest on her knees because she could not make it to the toilet. Pulled emergency bell calling code hemorrhage. Other staff entered room as we assisted patient onto the steady lift to get patient back to bed. Patient shortly passed out while attempting to transfer her from the steady to the bed as RN's transferred her to lying position. Large amount of clots and bleeding noted upon fundal massage; uterus was still firm. Foley place, additional IV line placed, methergine IM given, LR and pit bolus started. Bleeding subsided, urine drained. Multiple assessment and vitals taken after code hemorrhage. Vital signs stabilized.  Patient states she is in no pain.  See flowsheets. Will continue to monitor.  Earl Gala, Linda Hedges [place]

## 2018-08-25 NOTE — Discharge Summary (Addendum)
OB Discharge Summary     Patient Name: Glenna DurandFatima Cadena DOB: 12/28/1980 MRN: [id number]  Date of admission: 08/25/2018 Delivering MD: Standley BrookingKELLOGG, ALEXANDRA L   Date of discharge: 08/27/2018  Admitting diagnosis: 38 wks, CTX Intrauterine pregnancy: 226w4d     Secondary diagnosis:  Active Problems:   Sickle cell trait (HCC)   History of macrosomia in infant in prior pregnancy, currently pregnant   Short interval between pregnancies affecting pregnancy, antepartum   Thrombocytopenia affecting pregnancy (HCC)   Normal labor  Additional problems: Delayed Post Partum Hemorrhage      Discharge diagnosis: Term Pregnancy Delivered                                                                                                Post partum procedures:IV feraheme  Augmentation: AROM  Complications: None  Hospital course:  Onset of Labor With Vaginal Delivery     38 y.o. yo [id number] at 3526w4d was admitted in Active Labor on 08/25/2018. Patient had an uncomplicated, precipitous labor course as follows:  Membrane Rupture Time/Date: 12:38 PM ,08/25/2018   Intrapartum Procedures: Episiotomy: None [1]                                         Lacerations:  None [1]  Patient had a delivery of a Viable infant. 08/25/2018  Information for the patient's newborn:  Hughie ClossMbacke, Boy Kiauna [[id number]][[id number]]       Pateint had an uncomplicated postpartum course.  She is ambulating, tolerating a regular diet, passing flatus, and urinating well. Patient is discharged home in stable condition on 08/27/18.   Physical exam  Vitals:   08/26/18 1353 08/26/18 1355 08/26/18 2220 08/27/18 0553  BP: 115/67 115/68 (!) 102/57 (!) 100/54  Pulse: 75 (!) 104 68 71  Resp:   16 18  Temp:    98.1 F (36.7 C)  TempSrc:    Oral  SpO2:   97% 97%  Weight:      Height:       General: alert, cooperative and no distress Lochia: appropriate Uterine Fundus: firm Incision: N/A DVT Evaluation: No evidence of DVT seen on physical  exam. No cords or calf tenderness. No significant calf/ankle edema. Labs: Lab Results  Component Value Date   WBC 9.4 08/26/2018   HGB 7.9 (L) 08/27/2018   HCT 24.0 (L) 08/27/2018   MCV 81.4 08/26/2018   PLT 107 (L) 08/26/2018   No flowsheet data found.  Discharge instruction: per After Visit Summary and "Baby and Me Booklet".  After visit meds:  Allergies as of 08/27/2018   No Known Allergies     Medication List    TAKE these medications   acetaminophen 325 MG tablet Commonly known as:  TYLENOL Take 2 tablets (650 mg total) by mouth every 4 (four) hours as needed (for pain scale < 4).   ferrous sulfate 325 (65 FE) MG tablet Take 1 tablet (325 mg total) by mouth 2 (two) times daily with a meal.   ibuprofen  600 MG tablet Commonly known as:  ADVIL,MOTRIN Take 1 tablet (600 mg total) by mouth every 6 (six) hours.   prenatal vitamin w/FE, FA 29-1 MG Chew chewable tablet Chew 1 tablet by mouth daily.   senna-docusate 8.6-50 MG tablet Commonly known as:  Senokot-S Take 2 tablets by mouth daily. Start taking on:  August 28, 2018     Please schedule this patient for Postpartum visit in: 4 weeks with the following provider: Any provider For C/S patients schedule nurse incision check in weeks 2 weeks: no Low risk pregnancy complicated by: gest thrombocytopenia Delivery mode:  SVD Anticipated Birth Control:  Nexplanon PP Procedures needed: none  Schedule Integrated BH visit: no  Diet: routine diet  Activity: Advance as tolerated. Pelvic rest for 6 weeks.   Outpatient follow up:6 weeks Follow up Appt: Future Appointments  Date Time Provider Department Center  10/06/2018  1:15 PM Marylene LandKooistra, Jacolyn Joaquin [place], CNM [facility]   Follow up Visit:No follow-ups on file.  Postpartum contraception: Nexplanon  Newborn Data: Live born female  Birth Weight: 3420gm APGAR: 9, 9  Newborn Delivery   Birth date/time:  08/25/2018 12:55:00 Delivery type:  Vaginal, Spontaneous      Baby Feeding: Breast Disposition:home with mother   08/27/2018 Standley BrookingAlexandra L Kellogg, DO  I confirm that I have verified the information documented in the resident student's note and that I have also personally reperformed the history, physical exam and all medical decision making activities of this service and have verified that all service and findings are accurately documented in this student's note.   -Patient doing well; feeling fine. Has been ambulating and eating.  -Reviewed H and H was 7.9; she is s/p IV feraheme.  -Discussed with Dr. Earlene Plateravis, who agrees that patient is stable for discharge.  -Platelets stable at [address]  Marylene LandKooistra, Haston Casebolt [place], [place] 08/27/2018 10:30 AM

## 2018-08-25 NOTE — MAU Note (Signed)
Fetal tracing from 1125-1130 found on MRN [id number]

## 2018-08-25 NOTE — Progress Notes (Signed)
Patient ID: Melanie Peterson, female   DOB: Dec 09, 1980, 38 y.o.   MRN: [id number]  Responded to Code Hemorrhage. Patient found being supported by husband in bathroom - large amount of blood on bathroom floor. Was being brought back to bed. IV fluid bolus started. Fundal massage given with large amount of clot expressed from uterus. Second IV started with stat CBC drawn. Pit bolus started and methergine IM given. Foley catheter placed with approximately 300-448mL of urine drained from bladder. Vitals stable with BP slightly increased, likely due to methergine. Will continue on methergine series. Total blood loss .  Levie Heritage, DO 08/25/2018

## 2018-08-25 NOTE — Lactation Note (Signed)
This note was copied from a baby's chart. Lactation Consultation Note  Patient Name: Melanie Peterson [id number] Date: 08/25/2018 Reason for consult: Initial assessment;Early term 59-38.6wks  8 hours old early term baby who is now being mostly formula fed by his mother, she's a P4. Mom had a PPH and she's still recovering in her room, mom asleep and very tired when entering the room, dad voiced that they're just going to be feeding baby formula in the meantime until mom recovers. LC saw a bottle of Similac 20 calorie formula already opened in the room, educated dad on formula storage guidelines. Left BF brochure, BF resources and feeding diary with dad in case mom changes her mind later and decides to BF.   Maternal Data    Feeding Feeding Type: Breast Fed  LATCH Score                   Interventions Interventions: Breast feeding basics reviewed  Lactation Tools Discussed/Used     Consult Status Consult Status: PRN Follow-up type: In-patient    Melanie Peterson [name] 08/25/2018, 9:00 PM

## 2018-08-26 ENCOUNTER — Encounter: Payer: Self-pay | Admitting: Medical

## 2018-08-26 LAB — CBC
HCT: 24.1 % — ABNORMAL LOW (ref 36.0–46.0)
Hemoglobin: 8 g/dL — ABNORMAL LOW (ref 12.0–15.0)
MCH: 27 pg (ref 26.0–34.0)
MCHC: 33.2 g/dL (ref 30.0–36.0)
MCV: 81.4 fL (ref 80.0–100.0)
Platelets: 107 10*3/uL — ABNORMAL LOW (ref 150–400)
RBC: 2.96 MIL/uL — ABNORMAL LOW (ref 3.87–5.11)
RDW: 14 % (ref 11.5–15.5)
WBC: 9.4 10*3/uL (ref 4.0–10.5)
nRBC: 0 % (ref 0.0–0.2)

## 2018-08-26 LAB — RPR: RPR Ser Ql: NONREACTIVE

## 2018-08-26 MED ORDER — FERROUS SULFATE 325 (65 FE) MG PO TABS
325.0000 mg | ORAL_TABLET | Freq: Two times a day (BID) | ORAL | Status: DC
  Administered 2018-08-26 – 2018-08-27 (×3): 325 mg via ORAL
  Filled 2018-08-26 (×3): qty 1

## 2018-08-26 MED ORDER — SODIUM CHLORIDE 0.9 % IV SOLN
510.0000 mg | Freq: Once | INTRAVENOUS | Status: AC
  Administered 2018-08-26: 510 mg via INTRAVENOUS
  Filled 2018-08-26: qty 17

## 2018-08-26 MED ORDER — METHYLERGONOVINE MALEATE 0.2 MG PO TABS
0.2000 mg | ORAL_TABLET | Freq: Four times a day (QID) | ORAL | Status: AC
  Administered 2018-08-26: 0.2 mg via ORAL
  Filled 2018-08-26: qty 1

## 2018-08-26 NOTE — Lactation Note (Signed)
This note was copied from a baby's chart. Lactation Consultation Note  Patient Name: Boy Vertia Skov [id number]'Q Date: 08/26/2018 Reason for consult: Follow-up assessment;Early term 37-38.6wks   Follow up with mom of 26 hour old infant. Mom had infant latched to the right breast and infant actively feeding. Mom denied pain with feeding. Infant self detached after feeding. Mom in a lot of pain from uterine cramping, Rachael, RN was notified via phone that mom is requesting pain meds.   Discussed with mom that since she had a PPH of > 2200 ml that I would recommend that she BF infant with each feeding and especially before giving formula. Enc mom to pump if she is not wanting to put infant to the breast. Mom voiced understanding. Mom did not wish to pump at this time.   Obtained mom more pillows to support infant at the breast. Dad present and involved in helping mom and infant. Mom reports she does not need LC Assistance at this time. Enc mom to call out for assistance as needed.    Maternal Data Has patient been taught Hand Expression?: Yes Does the patient have breastfeeding experience prior to this delivery?: Yes  Feeding Feeding Type: Breast Fed  LATCH Score Latch: Grasps breast easily, tongue down, lips flanged, rhythmical sucking.  Audible Swallowing: A few with stimulation  Type of Nipple: Everted at rest and after stimulation  Comfort (Breast/Nipple): Soft / non-tender  Hold (Positioning): No assistance needed to correctly position infant at breast.  LATCH Score: 9  Interventions Interventions: Breast feeding basics reviewed;Support pillows  Lactation Tools Discussed/Used     Consult Status Consult Status: Follow-up Date: 08/27/18 Follow-up type: In-patient    Silas Flood Skylen Danielsen 08/26/2018, 3:00 PM

## 2018-08-26 NOTE — Progress Notes (Signed)
Interval Progress Note  Subjective: RN called team because patient tired today, IVF and foley catheter still in place. HgB from 12.6>11.0>8 after delayed PPH yesterday afternoon. Received methergine x 24 hours. Patient states she's feeling well, just tired. Denies lightheadedness, blurry vision.   Orthostatic BP: lying (112/65, HR71)  sitting (115/67, HR 75)  standing (115/68, HR 104) Gen: tired appearing but NAD  Assessment/Plan: PPD#1 with PPH yesterday of 2.4L -- remove foley -- stop IVF -- IV feraheme  Jaylin Roundy S. Earlene Plater, DO OB/GYN Fellow

## 2018-08-27 LAB — HEMOGLOBIN AND HEMATOCRIT, BLOOD
HCT: 24 % — ABNORMAL LOW (ref 36.0–46.0)
Hemoglobin: 7.9 g/dL — ABNORMAL LOW (ref 12.0–15.0)

## 2018-08-27 MED ORDER — ACETAMINOPHEN 325 MG PO TABS: 650.0000 mg | ORAL_TABLET | ORAL | 0 refills | Status: DC | PRN

## 2018-08-27 MED ORDER — IBUPROFEN 600 MG PO TABS: 600.0000 mg | ORAL_TABLET | Freq: Four times a day (QID) | ORAL | 0 refills | Status: DC

## 2018-08-27 MED ORDER — SENNOSIDES-DOCUSATE SODIUM 8.6-50 MG PO TABS: 2.0000 | ORAL_TABLET | ORAL | 0 refills | Status: DC

## 2018-08-27 MED ORDER — FERROUS SULFATE 325 (65 FE) MG PO TABS: 325.0000 mg | ORAL_TABLET | Freq: Two times a day (BID) | ORAL | 3 refills | Status: DC

## 2018-08-27 NOTE — Lactation Note (Signed)
This note was copied from a baby's chart. Lactation Consultation Note:  Mother reports that infant was just fed 60 ml of formula.  Mother was given a harmony hand pump with instructions to post pump after feedings to protect milk supply. Mother fit with a #24 flange. Observed a few drops of milk.   Discussed treatment and prevention of engorgement.  Discussed S/S of mastitis.  Mother reports that she last breastfed infant early am and that infant was breastfeeding well. She denies having any nipple or breast discomfort.   Encouraged mother to breastfeed on cue and feed infant 8-12 times or more in 24 hours.  Discussed cluster feeding.  Advised mother to limit activities, get plenty of rest and drink plenty of fluids to supplement large blood loss.   Mother receptive to all teaching. FOB at the bedside and reports that mother has lots of support at home.   [facility].  Suggested that mother page LC piror to discharge to observe feeding. [facility].   Patient Name: Melanie Glenna DurandFatima Peterson ZOXWR'UToday's Date: 08/27/2018 Reason for consult: Follow-up assessment   Maternal Data    Feeding Feeding Type: Bottle Fed - Formula  LATCH Score                   Interventions Interventions: Expressed milk;Hand pump  Lactation Tools Discussed/Used     Consult Status Consult Status: Complete    Michel BickersKendrick, Shakerria Parran McCoy 08/27/2018, 10:59 AM

## 2018-09-02 ENCOUNTER — Encounter: Payer: Self-pay | Admitting: Medical

## 2018-10-06 ENCOUNTER — Telehealth: Payer: Self-pay | Admitting: Student

## 2018-10-06 ENCOUNTER — Ambulatory Visit (INDEPENDENT_AMBULATORY_CARE_PROVIDER_SITE_OTHER): Payer: Medicaid Other | Admitting: Student

## 2018-10-06 ENCOUNTER — Other Ambulatory Visit: Payer: Self-pay

## 2018-10-06 DIAGNOSIS — Z1389 Encounter for screening for other disorder: Secondary | ICD-10-CM | POA: Diagnosis not present

## 2018-10-06 NOTE — Telephone Encounter (Signed)
Attempted to call patient to get her scheduled for her nexplanon placement. No answer and could not leave a voicemail.

## 2018-10-06 NOTE — Progress Notes (Signed)
   TELEHEALTH VIRTUAL GYNECOLOGY VISIT ENCOUNTER NOTE  I connected with Melanie Peterson on 10/06/18 at  1:15 PM EDT by telephone at home and verified that I am speaking with the correct person using two identifiers.   I discussed the limitations, risks, security and privacy concerns of performing an evaluation and management service by telephone and the availability of in person appointments. I also discussed with the patient that there may be a patient responsible charge related to this service. The patient expressed understanding and agreed to proceed.    Subjective:     Melanie Peterson is a 38 y.o. female who presents via telehealth for a postpartum visit. She is 6 weeks  postpartum following a spontaneous vaginal delivery. I have fully reviewed the prenatal and intrapartum course. The delivery was at 38  gestational weeks. Outcome: NSVD. Anesthesia: none. Postpartum course has been uneventful. She had a delayed PP hemorrhage while IP, but has stopped bleeding now. . Baby's course has been uneventful. Baby is feeding by both, but most of the time she does the breast. . Bleeding no bleeding. Bowel function is normal. Bladder function is normal. Patient is sexually active; she used protection. Contraception method is planning. Postpartum depression screening: negative via telehealth.  The following portions of the patient's history were reviewed and updated as appropriate: allergies, current medications, past family history, past medical history, past social history, past surgical history and problem list.  Review of Systems Pertinent items are noted in HPI.   Objective:  -Unable to assess; patient reports that she is well-feeling and active.    LMP 11/28/2017        Assessment:    Healthy postpartum exam via telehealth. Pap smear not done at today's visit.   Plan:    1. Contraception: Nexplanon 2.  Will send message to clinic to schedule for her nexplanon placement. Emphasized to patient  that she needs to wait until her nexplanon is placed or use a barrier method.   3. Follow up as soon as possible.    I provided 10 minutes of non-face-to-face time during this encounter.   Marylene Land, CNM Center for [employer], [facility]

## 2018-10-06 NOTE — Addendum Note (Signed)
Addended by: Chrystie Nose on: 10/06/2018 02:14 PM   Modules accepted: Level of Service

## 2019-02-02 ENCOUNTER — Ambulatory Visit: Payer: Medicaid Other | Admitting: Student

## 2019-02-02 ENCOUNTER — Encounter: Payer: Self-pay | Admitting: Student

## 2019-05-19 ENCOUNTER — Encounter: Payer: Self-pay | Admitting: Advanced Practice Midwife

## 2019-05-19 ENCOUNTER — Other Ambulatory Visit: Payer: Self-pay

## 2019-05-19 ENCOUNTER — Ambulatory Visit (INDEPENDENT_AMBULATORY_CARE_PROVIDER_SITE_OTHER): Payer: Medicaid Other | Admitting: Advanced Practice Midwife

## 2019-05-19 VITALS — BP 112/67 | HR 71 | Ht 64.0 in | Wt 158.3 lb

## 2019-05-19 DIAGNOSIS — O99119 Other diseases of the blood and blood-forming organs and certain disorders involving the immune mechanism complicating pregnancy, unspecified trimester: Secondary | ICD-10-CM | POA: Diagnosis not present

## 2019-05-19 DIAGNOSIS — N390 Urinary tract infection, site not specified: Secondary | ICD-10-CM

## 2019-05-19 DIAGNOSIS — Z0000 Encounter for general adult medical examination without abnormal findings: Secondary | ICD-10-CM | POA: Diagnosis not present

## 2019-05-19 DIAGNOSIS — D696 Thrombocytopenia, unspecified: Secondary | ICD-10-CM

## 2019-05-19 LAB — CBC
Hematocrit: 40.4 % (ref 34.0–46.6)
Hemoglobin: 13.4 g/dL (ref 11.1–15.9)
MCH: 26.9 pg (ref 26.6–33.0)
MCHC: 33.2 g/dL (ref 31.5–35.7)
MCV: 81 fL (ref 79–97)
Platelets: 255 10*3/uL (ref 150–450)
RBC: 4.98 x10E6/uL (ref 3.77–5.28)
RDW: 12.7 % (ref 11.7–15.4)
WBC: 4.1 10*3/uL (ref 3.4–10.8)

## 2019-05-19 LAB — POCT URINALYSIS DIP (DEVICE)
Bilirubin Urine: NEGATIVE
Glucose, UA: NEGATIVE mg/dL
Hgb urine dipstick: NEGATIVE
Ketones, ur: NEGATIVE mg/dL
Leukocytes,Ua: NEGATIVE
Nitrite: NEGATIVE
Protein, ur: NEGATIVE mg/dL
Specific Gravity, Urine: 1.02 (ref 1.005–1.030)
Urobilinogen, UA: 2 mg/dL — ABNORMAL HIGH (ref 0.0–1.0)
pH: 6.5 (ref 5.0–8.0)

## 2019-05-19 MED ORDER — CLOBETASOL PROPIONATE 0.05 % EX OINT: 1.0000 "application " | TOPICAL_OINTMENT | Freq: Two times a day (BID) | CUTANEOUS | 2 refills | Status: DC

## 2019-05-19 MED ORDER — NORGESTIMATE-ETH ESTRADIOL 0.25-35 MG-MCG PO TABS: 1.0000 | ORAL_TABLET | Freq: Every day | ORAL | 11 refills | Status: DC

## 2019-05-19 NOTE — Progress Notes (Signed)
Pt states has been having Irregular period, last Cycle was for 15 days straight, before that 19 days. She has been taking Plan B pill.Pt wants BC Pills.

## 2019-05-19 NOTE — Patient Instructions (Signed)
Preventive Care 21-36 Years Old, Female Preventive care refers to visits with your health care provider and lifestyle choices that can promote health and wellness. This includes:  A yearly physical exam. This may also be called an annual well check.  Regular dental visits and eye exams.  Immunizations.  Screening for certain conditions.  Healthy lifestyle choices, such as eating a healthy diet, getting regular exercise, not using drugs or products that contain nicotine and tobacco, and limiting alcohol use. What can I expect for my preventive care visit? Physical exam Your health care provider will check your:  Height and weight. This may be used to calculate body mass index (BMI), which tells if you are at a healthy weight.  Heart rate and blood pressure.  Skin for abnormal spots. Counseling Your health care provider may ask you questions about your:  Alcohol, tobacco, and drug use.  Emotional well-being.  Home and relationship well-being.  Sexual activity.  Eating habits.  Work and work environment.  Method of birth control.  Menstrual cycle.  Pregnancy history. What immunizations do I need?  Influenza (flu) vaccine  This is recommended every year. Tetanus, diphtheria, and pertussis (Tdap) vaccine  You may need a Td booster every 10 years. Varicella (chickenpox) vaccine  You may need this if you have not been vaccinated. Human papillomavirus (HPV) vaccine  If recommended by your health care provider, you may need three doses over 6 months. Measles, mumps, and rubella (MMR) vaccine  You may need at least one dose of MMR. You may also need a second dose. Meningococcal conjugate (MenACWY) vaccine  One dose is recommended if you are age 19-21 years and a first-year college student living in a residence hall, or if you have one of several medical conditions. You may also need additional booster doses. Pneumococcal conjugate (PCV13) vaccine  You may need  this if you have certain conditions and were not previously vaccinated. Pneumococcal polysaccharide (PPSV23) vaccine  You may need one or two doses if you smoke cigarettes or if you have certain conditions. Hepatitis A vaccine  You may need this if you have certain conditions or if you travel or work in places where you may be exposed to hepatitis A. Hepatitis B vaccine  You may need this if you have certain conditions or if you travel or work in places where you may be exposed to hepatitis B. Haemophilus influenzae type b (Hib) vaccine  You may need this if you have certain conditions. You may receive vaccines as individual doses or as more than one vaccine together in one shot (combination vaccines). Talk with your health care provider about the risks and benefits of combination vaccines. What tests do I need?  Blood tests  Lipid and cholesterol levels. These may be checked every 5 years starting at age 20.  Hepatitis C test.  Hepatitis B test. Screening  Diabetes screening. This is done by checking your blood sugar (glucose) after you have not eaten for a while (fasting).  Sexually transmitted disease (STD) testing.  BRCA-related cancer screening. This may be done if you have a family history of breast, ovarian, tubal, or peritoneal cancers.  Pelvic exam and Pap test. This may be done every 3 years starting at age 21. Starting at age 30, this may be done every 5 years if you have a Pap test in combination with an HPV test. Talk with your health care provider about your test results, treatment options, and if necessary, the need for more tests.   Follow these instructions at home: Eating and drinking   Eat a diet that includes fresh fruits and vegetables, whole grains, lean protein, and low-fat dairy.  Take vitamin and mineral supplements as recommended by your health care provider.  Do not drink alcohol if: ? Your health care provider tells you not to drink. ? You are  pregnant, may be pregnant, or are planning to become pregnant.  If you drink alcohol: ? Limit how much you have to 0-1 drink a day. ? Be aware of how much alcohol is in your drink. In the [place] one drink equals one 12 oz bottle of beer (355 mL), one 5 oz glass of wine (148 mL), or one 1 oz glass of hard liquor (44 mL). Lifestyle  Take daily care of your teeth and gums.  Stay active. Exercise for at least 30 minutes on 5 or more days each week.  Do not use any products that contain nicotine or tobacco, such as cigarettes, e-cigarettes, and chewing tobacco. If you need help quitting, ask your health care provider.  If you are sexually active, practice safe sex. Use a condom or other form of birth control (contraception) in order to prevent pregnancy and STIs (sexually transmitted infections). If you plan to become pregnant, see your health care provider for a preconception visit. What's next?  Visit your health care provider once a year for a well check visit.  Ask your health care provider how often you should have your eyes and teeth checked.  Stay up to date on all vaccines. This information is not intended to replace advice given to you by your health care provider. Make sure you discuss any questions you have with your health care provider. Document Released: 08/27/2001 Document Revised: 03/12/2018 Document Reviewed: 03/12/2018 Elsevier Patient Education  2020 Elsevier Inc.  

## 2019-05-19 NOTE — Progress Notes (Signed)
GYNECOLOGY ANNUAL PREVENTATIVE CARE ENCOUNTER NOTE  History:     Melanie Peterson is a 38 y.o. [id number] female here for a routine annual gynecologic exam. Current complaints: short menstrual cycles for the past 3-4 months. She endorses 19-21 day cycles with 3-5 days of bleeding.   Patient Is s/p SVD 08/25/18. She has not used contraception since that time, formula feeding. She endorses using Plan B for unprotected intercourse "about ten times" since her delivery. Patient desires OCP. She states her husband would like her to be pregnant but she knows the timing is not right.  She denies concern for SI, HI, IPV.   Denies heavy vaginal bleeding, discharge, pelvic pain, problems with intercourse or other gynecologic concerns.    Gynecologic History Patient's last menstrual period was 05/07/2019 (exact date). Contraception: Plan B Last Pap: 09/2016. Results were: normal with negative HPV Last mammogram: N/A age 11.   Obstetric History OB History  Gravida Para Term Preterm AB Living  5 4 4  0 1 4  SAB TAB Ectopic Multiple Live Births  1 0 0 0 4    # Outcome Date GA Lbr Len/2nd Weight Sex Delivery Anes PTL Lv  5 Term 08/25/18 [redacted]w[redacted]d 02:38 / 00:17 7 lb 8.6 oz (3.42 kg) M Vag-Spont None  LIV  4 Term 2018 [redacted]w[redacted]d  8 lb (3.629 kg) F   N LIV  3 Term 01/19/11 [redacted]w[redacted]d / 00:10 10 lb (4.536 kg) M Vag-Spont   LIV  2 Term 10/18/07 [redacted]w[redacted]d  6 lb 7 oz (2.92 kg) F Vag-Spont EPI N LIV  1 SAB 07/15/06            No past medical history on file.  No past surgical history on file.  Current Outpatient Medications on File Prior to Visit  Medication Sig Dispense Refill  . acetaminophen (TYLENOL) 325 MG tablet Take 2 tablets (650 mg total) by mouth every 4 (four) hours as needed (for pain scale < 4). 60 tablet 0  . ibuprofen (ADVIL,MOTRIN) 600 MG tablet Take 1 tablet (600 mg total) by mouth every 6 (six) hours. 30 tablet 0  . prenatal vitamin w/FE, FA (NATACHEW) 29-1 MG CHEW chewable tablet Chew 1 tablet by mouth  daily. 30 tablet 6  . ferrous sulfate 325 (65 FE) MG tablet Take 1 tablet (325 mg total) by mouth 2 (two) times daily with a meal. (Patient not taking: Reported on 05/19/2019) 60 tablet 3  . senna-docusate (SENOKOT-S) 8.6-50 MG tablet Take 2 tablets by mouth daily. (Patient not taking: Reported on 05/19/2019) 60 tablet 0   No current facility-administered medications on file prior to visit.     No Known Allergies  Social History:  reports that she has never smoked. She has never used smokeless tobacco. She reports that she does not drink alcohol or use drugs.  Family History  Problem Relation Age of Onset  . Diabetes Mother   . Hypertension Mother   . Hypertension Maternal Grandmother   . Heart disease Maternal Grandmother     The following portions of the patient's history were reviewed and updated as appropriate: allergies, current medications, past family history, past medical history, past social history, past surgical history and problem list.  Review of Systems Pertinent items noted in HPI and remainder of comprehensive ROS otherwise negative.  Physical Exam:  BP 112/67   Pulse 71   Ht 5\' 4"  (1.626 m)   Wt 158 lb 4.8 oz (71.8 kg)   LMP 05/07/2019 (Exact Date)  BMI 27.17 kg/m  CONSTITUTIONAL: Well-developed, well-nourished female in no acute distress.  HENT:  Normocephalic, atraumatic, External right and left ear normal. Oropharynx is clear and moist EYES: Conjunctivae and EOM are normal. Pupils are equal, round, and reactive to light. No scleral icterus.  NECK: Normal range of motion, supple, no masses.  Normal thyroid.  SKIN: Skin is warm and dry. No rash noted. Not diaphoretic. No erythema. No pallor. MUSCULOSKELETAL: Normal range of motion. No tenderness.  No cyanosis, clubbing, or edema.  2+ distal pulses. NEUROLOGIC: Alert and oriented to person, place, and time. Normal reflexes, muscle tone coordination. No cranial nerve deficit noted. PSYCHIATRIC: Normal mood and  affect. Normal behavior. Normal judgment and thought content. CARDIOVASCULAR: Normal heart rate noted, regular rhythm RESPIRATORY: Clear to auscultation bilaterally. Effort and breath sounds normal, no problems with respiration noted.     Assessment and Plan:    1. Encounter for well woman exam without gynecological exam - No concerning findings on physical exam. - Previously on Sprintec for 3 months without side effects. Rx today.  - Advised condoms for 7 days minimum from start of OCPs - Patient declines health maintenence blood work, declines pap today - Reviewed recommendations for diet and exercise  2. Thrombocytopenia affecting pregnancy ([place]) - Platelets 106 at hospital discharge - CBC  Routine preventative health maintenance measures emphasized. Please refer to After Visit Summary for other counseling recommendations.      F/U - Return to clinic for annual well woman with pap in one year  Total visit time 30 minutes. Greater than 50% of visit spent in counseling and coordination of care  Mallie Snooks, MSN, CNM Certified Nurse Midwife, [employer] for [employer], [facility] 05/19/19 1:49 PM

## 2019-05-21 LAB — CULTURE, OB URINE

## 2019-05-21 LAB — URINE CULTURE, OB REFLEX: Organism ID, Bacteria: NO GROWTH

## 2019-10-04 ENCOUNTER — Ambulatory Visit: Payer: Medicaid Other | Attending: Internal Medicine

## 2019-10-04 DIAGNOSIS — Z20822 Contact with and (suspected) exposure to covid-19: Secondary | ICD-10-CM

## 2019-10-05 LAB — NOVEL CORONAVIRUS, NAA: SARS-CoV-2, NAA: NOT DETECTED

## 2019-10-05 LAB — SARS-COV-2, NAA 2 DAY TAT

## 2020-03-30 DIAGNOSIS — Z20822 Contact with and (suspected) exposure to covid-19: Secondary | ICD-10-CM | POA: Diagnosis not present

## 2020-05-31 ENCOUNTER — Other Ambulatory Visit: Payer: Self-pay | Admitting: General Practice

## 2020-05-31 ENCOUNTER — Encounter: Payer: Self-pay | Admitting: General Practice

## 2020-05-31 DIAGNOSIS — Z0000 Encounter for general adult medical examination without abnormal findings: Secondary | ICD-10-CM

## 2020-05-31 MED ORDER — NORGESTIMATE-ETH ESTRADIOL 0.25-35 MG-MCG PO TABS: 1.0000 | ORAL_TABLET | Freq: Every day | ORAL | 2 refills | Status: DC

## 2020-07-15 NOTE — L&D Delivery Note (Signed)
OB/GYN Faculty Practice Delivery Note  Melanie Peterson is a 40 y.o. [id number] s/p SVD at [redacted]w[redacted]d. She was admitted for spontaneous onset of labor.   ROM: 0h 64m fluid with particulate meconium GBS Status: Positive, one dose of ampicillin given Maximum Maternal Temperature: 98.8  Labor Progress: Pt 8cm on arrival to L&D but requesting epidural. Explained that the epidural may not be helpful but pt insisted. Epidural placed and immediately pt began feeling the urge to push. Pt complete with a bulging bag, AROM completed with particulate meconium.  Delivery Date/Time: 06/07/21 at 0443 Delivery: Pushed with patient after AROM. NICU team called to bedside r/t particulate meconium (reactive tracing even during pushing stage). Head delivered LOA. No nuchal cord present. Mom stopped pushing after delivery of head, needed McRoberts, suprapubic pressure and grasping of the anterior axilla to get baby out with minimal maternal effort. Infant with spontaneous cry, placed on mother's abdomen, dried and stimulated. NICU team cleared to leave. Cord clamped x 2 after 1-minute delay, and cut by CNM. Cord blood drawn. Placenta delivered spontaneously, intact, with 3-vessel cord. Fundus firm with massage and Pitocin (and TXA that had been given just before pushing stage began). Labia, perineum, vagina, and cervix inspected, non-hemostatic first degree laceration found and repaired with 3.0 vicryl.   After repair completed, a slow trickle was noted so one dose of methergine given.  Placenta: intact, spontaneous, 3-vessel cord Complications: none Lacerations: 1st degree perineal EBL: 125 Analgesia: epidural (but never took effect before baby was born)  Postpartum Planning [x]  transfer orders to MB [x]  discharge summary started & shared [x]  message to sent to schedule follow-up  [x]  lists updated [x]  vaccines UTD  Infant: Boy(yes)  APGARs 9/9  [id number]  , CNM, IBCLC Certified Nurse Midwife, [facility] for , [facility] 06/07/2021, 6:08 AM

## 2020-10-24 ENCOUNTER — Ambulatory Visit (INDEPENDENT_AMBULATORY_CARE_PROVIDER_SITE_OTHER): Payer: Medicaid Other

## 2020-10-24 VITALS — BP 105/66 | HR 66 | Ht 64.0 in | Wt 154.7 lb

## 2020-10-24 DIAGNOSIS — Z3201 Encounter for pregnancy test, result positive: Secondary | ICD-10-CM

## 2020-10-24 LAB — POCT PREGNANCY, URINE: Preg Test, Ur: POSITIVE — AB

## 2020-10-24 NOTE — Progress Notes (Signed)
Pt here today for UPT. UPT in office is positive. Pt aware of results. Pt states has taken home UPT and was positive as well. Pt denies any vaginal bleeding, discharge, cramps or pain.    LMP 09/09/20 EDD 06/16/2021 6 weeks 3 days today.   G6P4  Pt advised to start taking PNV. Pt advised to go to MAU if any heavy bleeding, clots or severe abd pain before new OB appt. Pt advised to make new OB intake appt at checkout. Pt verbalized understanding and is agreeable to plan of care.   Judeth Cornfield, RN  10/24/20

## 2020-10-25 NOTE — Progress Notes (Signed)
Chart reviewed for nurse visit. Agree with plan of care.   Venia Carbon I, NP 10/25/2020 10:00 AM

## 2020-11-21 ENCOUNTER — Telehealth (INDEPENDENT_AMBULATORY_CARE_PROVIDER_SITE_OTHER): Payer: Medicaid Other | Admitting: *Deleted

## 2020-11-21 ENCOUNTER — Encounter: Payer: Self-pay | Admitting: *Deleted

## 2020-11-21 ENCOUNTER — Other Ambulatory Visit: Payer: Self-pay | Admitting: Obstetrics and Gynecology

## 2020-11-21 ENCOUNTER — Other Ambulatory Visit: Payer: Self-pay

## 2020-11-21 DIAGNOSIS — D696 Thrombocytopenia, unspecified: Secondary | ICD-10-CM

## 2020-11-21 DIAGNOSIS — Z87898 Personal history of other specified conditions: Secondary | ICD-10-CM

## 2020-11-21 DIAGNOSIS — O09529 Supervision of elderly multigravida, unspecified trimester: Secondary | ICD-10-CM

## 2020-11-21 DIAGNOSIS — Z3490 Encounter for supervision of normal pregnancy, unspecified, unspecified trimester: Secondary | ICD-10-CM

## 2020-11-21 DIAGNOSIS — O09299 Supervision of pregnancy with other poor reproductive or obstetric history, unspecified trimester: Secondary | ICD-10-CM | POA: Insufficient documentation

## 2020-11-21 DIAGNOSIS — O99119 Other diseases of the blood and blood-forming organs and certain disorders involving the immune mechanism complicating pregnancy, unspecified trimester: Secondary | ICD-10-CM

## 2020-11-21 DIAGNOSIS — Z862 Personal history of diseases of the blood and blood-forming organs and certain disorders involving the immune mechanism: Secondary | ICD-10-CM

## 2020-11-21 DIAGNOSIS — L309 Dermatitis, unspecified: Secondary | ICD-10-CM

## 2020-11-21 DIAGNOSIS — Z3A00 Weeks of gestation of pregnancy not specified: Secondary | ICD-10-CM

## 2020-11-21 DIAGNOSIS — O0990 Supervision of high risk pregnancy, unspecified, unspecified trimester: Secondary | ICD-10-CM | POA: Insufficient documentation

## 2020-11-21 MED ORDER — BLOOD PRESSURE KIT DEVI: 1.0000 | 0 refills | Status: AC | PRN

## 2020-11-21 MED ORDER — CLOBETASOL PROPIONATE 0.05 % EX OINT
1.0000 | TOPICAL_OINTMENT | Freq: Two times a day (BID) | CUTANEOUS | 2 refills | Status: DC
Start: 2020-11-21 — End: 2022-01-21

## 2020-11-21 MED ORDER — COMPLETENATE 29-1 MG PO CHEW: 1.0000 | CHEWABLE_TABLET | Freq: Every day | ORAL | 11 refills | Status: DC

## 2020-11-21 NOTE — Progress Notes (Signed)
New OB Intake  I connected with  Melanie Peterson on 11/21/20 at  9:15 AM EDT by MyChart video visit  and verified that I am speaking with the correct person using two identifiers. Nurse is located at [facility] and pt is located at home.  I discussed the limitations, risks, security and privacy concerns of performing an evaluation and management service by telephone and the availability of in person appointments. I also discussed with the patient that there may be a patient responsible charge related to this service. The patient expressed understanding and agreed to proceed.  I explained I am completing New OB Intake today. We discussed her EDD of 06/16/21 that is based on LMP of 09/09/20. Pt is G6/P4014. I reviewed her allergies, medications, Medical/Surgical/OB history, and appropriate screenings. I informed her of [facility] services. Based on history, this is a/an complicated by history thrombocytopenia, macrosomia, postpartum hemorrhage pregnancy.  RX for PNV sent in per protocol. Patient request RX for clobetaol cream for eczema. Explained will need to review with provider and then will send in rx.   Patient Active Problem List   Diagnosis Date Noted  . H/O postpartum hemorrhage, currently pregnant   . History of thrombocytopenia   . Supervision of high risk pregnancy, antepartum   . AMA (advanced maternal age) multigravida 35+   . Sickle cell trait (HCC) 02/10/2017  . History of anesthesia complications 02/10/2017  . History of macrosomia in infant in prior pregnancy, currently pregnant 02/10/2017    Concerns addressed today  Delivery Plans:  Plans to deliver at [facility].   MyChart/Babyscripts MyChart access verified. I explained pt will have some visits in office and some virtually. Babyscripts instructions given and order placed.   Blood Pressure Cuff Blood pressure cuff ordered for patient to pick-up from [facility]. Explained after first prenatal appt pt will check weekly and  document in Babyscripts.  Anatomy US Explained first scheduled [place] will be around 19 weeks.Patient will be notified by MyChart.    Labs Discussed [name] genetic screening with patient. Would like  Panorama drawn at new OB visit. Routine prenatal labs needed.  Covid Vaccine Patient has not covid vaccine.   Social Determinants of Health . Food Insecurity: Patient denies food insecurity. . WIC Referral: Patient is interested in referral to [facility]. Referral sent . Transportation: Patient denies transportation needs. . Childcare: Discussed no children allowed at ultrasound appointments. Offered childcare services; patient declines childcare services at this time.  First visit review I reviewed new OB appt with pt. I explained she will have a pelvic exam, ob bloodwork with genetic screening, and PAP smear. Explained pt will be seen by Dr. Alysia Penna at first visit; encounter routed to appropriate provider. Explained that patient will be seen by pregnancy navigator following visit with provider.  Cricket Goodlin,RN 11/21/2020  10:07 AM

## 2020-11-27 ENCOUNTER — Other Ambulatory Visit: Payer: Self-pay

## 2020-11-27 ENCOUNTER — Ambulatory Visit (INDEPENDENT_AMBULATORY_CARE_PROVIDER_SITE_OTHER): Payer: Medicaid Other | Admitting: Obstetrics and Gynecology

## 2020-11-27 ENCOUNTER — Encounter: Payer: Self-pay | Admitting: Obstetrics and Gynecology

## 2020-11-27 VITALS — BP 114/74 | HR 81 | Wt 157.9 lb

## 2020-11-27 DIAGNOSIS — D573 Sickle-cell trait: Secondary | ICD-10-CM

## 2020-11-27 DIAGNOSIS — O0990 Supervision of high risk pregnancy, unspecified, unspecified trimester: Secondary | ICD-10-CM | POA: Diagnosis not present

## 2020-11-27 DIAGNOSIS — O09521 Supervision of elderly multigravida, first trimester: Secondary | ICD-10-CM | POA: Diagnosis not present

## 2020-11-27 DIAGNOSIS — O09891 Supervision of other high risk pregnancies, first trimester: Secondary | ICD-10-CM | POA: Diagnosis not present

## 2020-11-27 DIAGNOSIS — Z31430 Encounter of female for testing for genetic disease carrier status for procreative management: Secondary | ICD-10-CM | POA: Diagnosis not present

## 2020-11-27 DIAGNOSIS — Z862 Personal history of diseases of the blood and blood-forming organs and certain disorders involving the immune mechanism: Secondary | ICD-10-CM

## 2020-11-27 LAB — HEPATITIS C ANTIBODY: HCV Ab: NEGATIVE

## 2020-11-27 MED ORDER — ASPIRIN EC 81 MG PO TBEC: 81.0000 mg | DELAYED_RELEASE_TABLET | Freq: Every day | ORAL | 2 refills | Status: DC

## 2020-11-27 NOTE — Progress Notes (Signed)
Subjective:  Melanie Peterson is a 39 y.o. [phone] at [redacted]w[redacted]d being seen today for her first OB visit. EDD by LMP. No chronic medical problems or medications. H/O TSVD x 5 without problems.  She is currently monitored for the following issues for this high-risk pregnancy and has Sickle cell trait (HCC); History of anesthesia complications; History of macrosomia in infant in prior pregnancy, currently pregnant; H/O postpartum hemorrhage, currently pregnant; History of thrombocytopenia; Supervision of high risk pregnancy, antepartum; and AMA (advanced maternal age) multigravida 35+ on their problem list.  Patient reports no complaints.   . Vag. Bleeding: None.  Movement: Absent. Denies leaking of fluid.   The following portions of the patient's history were reviewed and updated as appropriate: allergies, current medications, past family history, past medical history, past social history, past surgical history and problem list. Problem list updated.  Objective:   Vitals:   11/27/20 0941  BP: 114/74  Pulse: 81  Weight: 71.6 kg    Fetal Status: Fetal Heart Rate (bpm): 152   Movement: Absent     PE declined PE, desires female providers   Urinalysis:      Assessment and Plan:  Pregnancy: [id number] at [redacted]w[redacted]d  1. Supervision of high risk pregnancy, antepartum Prenatal care and labs reviewed with pt. Genetic testing discussed. Will start BASA qd, indications reviewed with pt. - CHL AMB BABYSCRIPTS SCHEDULE OPTIMIZATION - Culture, OB Urine - Genetic Screening - Hemoglobin A1c - ABO/Rh; Future - Antibody screen; Future - Hepatitis C antibody - Hepatitis B surface antigen - HIV Antibody (routine testing w rflx) - RPR - Rubella screen  2. Multigravida of advanced maternal age in first trimester Genetic testing reviewed - Comprehensive metabolic panel - Protein / creatinine ratio, urine - aspirin EC 81 MG tablet; Take 1 tablet (81 mg total) by mouth daily. Take after 12 weeks for prevention of  preeclampsia later in pregnancy  Dispense: 300 tablet; Refill: 2  3. History of thrombocytopenia CBC today  4. Sickle cell trait (HCC) UC q trimester  Preterm labor symptoms and general obstetric precautions including but not limited to vaginal bleeding, contractions, leaking of fluid and fetal movement were reviewed in detail with the patient. Please refer to After Visit Summary for other counseling recommendations.  Return in about 4 weeks (around 12/25/2020) for OB visit, perfers female providers.   Hermina Staggers, MD

## 2020-11-27 NOTE — Patient Instructions (Signed)
Obstetrics: Normal and Problem Pregnancies (7th ed., pp. 102-121). [place], [place]: Elsevier."> Textbook of Family Medicine (9th ed., pp. 365-410). [place], [place]: Else[employer].">  First Trimester of Pregnancy  The first trimester of pregnancy starts on the first day of your last menstrual period until the end of week 12. This is months 1 through 3 of pregnancy. A week after a sperm fertilizes an egg, the egg will implant into the wall of the uterus and begin to develop into a baby. By the end of 12 weeks, all the baby's organs will be formed and the baby will be 2-3 inches in size. Body changes during your first trimester Your body goes through many changes during pregnancy. The changes vary and generally return to normal after your baby is born. Physical changes  You may gain or lose weight.  Your breasts may begin to grow larger and become tender. The tissue that surrounds your nipples (areola) may become darker.  Dark spots or blotches (chloasma or mask of pregnancy) may develop on your face.  You may have changes in your hair. These can include thickening or thinning of your hair or changes in texture. Health changes  You may feel nauseous, and you may vomit.  You may have heartburn.  You may develop headaches.  You may develop constipation.  Your gums may bleed and may be sensitive to brushing and flossing. Other changes  You may tire easily.  You may urinate more often.  Your menstrual periods will stop.  You may have a loss of appetite.  You may develop cravings for certain kinds of food.  You may have changes in your emotions from day to day.  You may have more vivid and strange dreams. Follow these instructions at home: Medicines  Follow your health care provider's instructions regarding medicine use. Specific medicines may be either safe or unsafe to take during pregnancy. Do not take any medicines unless told to by your health care provider.  Take a  prenatal vitamin that contains at least 600 micrograms (mcg) of folic acid. Eating and drinking  Eat a healthy diet that includes fresh fruits and vegetables, whole grains, good sources of protein such as meat, eggs, or tofu, and low-fat dairy products.  Avoid raw meat and unpasteurized juice, milk, and cheese. These carry germs that can harm you and your baby.  If you feel nauseous or you vomit: ? Eat 4 or 5 small meals a day instead of 3 large meals. ? Try eating a few soda crackers. ? Drink liquids between meals instead of during meals.  You may need to take these actions to prevent or treat constipation: ? Drink enough fluid to keep your urine pale yellow. ? Eat foods that are high in fiber, such as beans, whole grains, and fresh fruits and vegetables. ? Limit foods that are high in fat and processed sugars, such as fried or sweet foods. Activity  Exercise only as directed by your health care provider. Most people can continue their usual exercise routine during pregnancy. Try to exercise for 30 minutes at least 5 days a week.  Stop exercising if you develop pain or cramping in the lower abdomen or lower back.  Avoid exercising if it is very hot or humid or if you are at high altitude.  Avoid heavy lifting.  If you choose to, you may have sex unless your health care provider tells you not to. Relieving pain and discomfort  Wear a good support bra to relieve breast   tenderness.  Rest with your legs elevated if you have leg cramps or low back pain.  If you develop bulging veins (varicose veins) in your legs: ? Wear support hose as told by your health care provider. ? Elevate your feet for 15 minutes, 3-4 times a day. ? Limit salt in your diet. Safety  Wear your seat belt at all times when driving or riding in a car.  Talk with your health care provider if someone is verbally or physically abusive to you.  Talk with your health care provider if you are feeling sad or have  thoughts of hurting yourself. Lifestyle  Do not use hot tubs, steam rooms, or saunas.  Do not douche. Do not use tampons or scented sanitary pads.  Do not use herbal remedies, alcohol, illegal drugs, or medicines that are not approved by your health care provider. Chemicals in these products can harm your baby.  Do not use any products that contain nicotine or tobacco, such as cigarettes, e-cigarettes, and chewing tobacco. If you need help quitting, ask your health care provider.  Avoid cat litter boxes and soil used by cats. These carry germs that can cause birth defects in the baby and possibly loss of the unborn baby (fetus) by miscarriage or stillbirth. General instructions  During routine prenatal visits in the first trimester, your health care provider will do a physical exam, perform necessary tests, and ask you how things are going. Keep all follow-up visits. This is important.  Ask for help if you have counseling or nutritional needs during pregnancy. Your health care provider can offer advice or refer you to specialists for help with various needs.  Schedule a dentist appointment. At home, brush your teeth with a soft toothbrush. Floss gently.  Write down your questions. Take them to your prenatal visits. Where to find more information  American Pregnancy Association: americanpregnancy.org  American College of Obstetricians and Gynecologists: acog.org/en/Womens%20Health/Pregnancy  Office on Women's Health: womenshealth.gov/pregnancy Contact a health care provider if you have:  Dizziness.  A fever.  Mild pelvic cramps, pelvic pressure, or nagging pain in the abdominal area.  Nausea, vomiting, or diarrhea that lasts for 24 hours or longer.  A bad-smelling vaginal discharge.  Pain when you urinate.  Known exposure to a contagious illness, such as chickenpox, measles, Zika virus, HIV, or hepatitis. Get help right away if you have:  Spotting or bleeding from your  vagina.  Severe abdominal cramping or pain.  Shortness of breath or chest pain.  Any kind of trauma, such as from a fall or a car crash.  New or increased pain, swelling, or redness in an arm or leg. Summary  The first trimester of pregnancy starts on the first day of your last menstrual period until the end of week 12 (months 1 through 3).  Eating 4 or 5 small meals a day rather than 3 large meals may help to relieve nausea and vomiting.  Do not use any products that contain nicotine or tobacco, such as cigarettes, e-cigarettes, and chewing tobacco. If you need help quitting, ask your health care provider.  Keep all follow-up visits. This is important. This information is not intended to replace advice given to you by your health care provider. Make sure you discuss any questions you have with your health care provider. Document Revised: 12/08/2019 Document Reviewed: 10/14/2019 Elsevier Patient Education  2021 Elsevier Inc.  

## 2020-11-28 LAB — PROTEIN / CREATININE RATIO, URINE
Creatinine, Urine: 35.9 mg/dL
Protein, Ur: 7.9 mg/dL
Protein/Creat Ratio: 220 mg/g creat — ABNORMAL HIGH (ref 0–200)

## 2020-11-28 LAB — ANTIBODY SCREEN: Antibody Screen: NEGATIVE

## 2020-11-28 LAB — ABO/RH: Rh Factor: POSITIVE

## 2020-11-29 LAB — HEMOGLOBIN A1C
Est. average glucose Bld gHb Est-mCnc: 105 mg/dL
Hgb A1c MFr Bld: 5.3 % (ref 4.8–5.6)

## 2020-11-29 LAB — HEPATITIS C ANTIBODY: Hep C Virus Ab: <0.1 s/co ratio (ref 0.0–0.9)

## 2020-11-29 LAB — RUBELLA SCREEN: Rubella Antibodies, IGG: 15.5 index (ref >0.99)

## 2020-11-29 LAB — RPR: RPR Ser Ql: NONREACTIVE

## 2020-11-29 LAB — HIV ANTIBODY (ROUTINE TESTING W REFLEX): HIV Screen 4th Generation wRfx: NONREACTIVE

## 2020-11-29 LAB — HEPATITIS B SURFACE ANTIGEN: Hepatitis B Surface Ag: NEGATIVE

## 2020-11-30 LAB — URINE CULTURE, OB REFLEX

## 2020-11-30 LAB — CULTURE, OB URINE

## 2020-12-05 ENCOUNTER — Encounter: Payer: Self-pay | Admitting: Student

## 2020-12-06 ENCOUNTER — Encounter: Payer: Self-pay | Admitting: *Deleted

## 2020-12-07 ENCOUNTER — Encounter: Payer: Self-pay | Admitting: *Deleted

## 2020-12-13 ENCOUNTER — Telehealth: Payer: Self-pay | Admitting: Lactation Services

## 2020-12-13 NOTE — Telephone Encounter (Signed)
Patient called and informed that her [facility] Screening shows she is a carrier for Sickle Cell. Mom reports she was aware she had the trait. She is not sure if her husband has been tested.   Gave her the number to [employer] to call and schedule a Genetic Counseling Session over the phone.   Recommended to patient that FOB be tested. She reports she will ask FOB and see if he will come to her next appointment. She was informed saliva kits are in the office.   Patient with no other questions or concerns at this time.

## 2020-12-15 ENCOUNTER — Encounter: Payer: Self-pay | Admitting: *Deleted

## 2020-12-25 ENCOUNTER — Other Ambulatory Visit: Payer: Self-pay

## 2020-12-25 ENCOUNTER — Ambulatory Visit (INDEPENDENT_AMBULATORY_CARE_PROVIDER_SITE_OTHER): Payer: Medicaid Other | Admitting: Student

## 2020-12-25 VITALS — BP 109/66 | HR 79 | Wt 162.3 lb

## 2020-12-25 DIAGNOSIS — Z3482 Encounter for supervision of other normal pregnancy, second trimester: Secondary | ICD-10-CM | POA: Diagnosis not present

## 2020-12-25 DIAGNOSIS — Z3A15 15 weeks gestation of pregnancy: Secondary | ICD-10-CM

## 2020-12-25 NOTE — Progress Notes (Signed)
   PRENATAL VISIT NOTE  Subjective:  Melanie Peterson is a 40 y.o. [id number] at [redacted]w[redacted]d being seen today for ongoing prenatal care.  She is currently monitored for the following issues for this low-risk pregnancy and has Sickle cell trait ([place]); History of anesthesia complications; History of macrosomia in infant in prior pregnancy, currently pregnant; H/O postpartum hemorrhage, currently pregnant; History of thrombocytopenia; Supervision of high risk pregnancy, antepartum; and AMA (advanced maternal age) multigravida 35+ on their problem list.  Patient reports fatigue. Patient is very concerned about repeat [place]. She reports that she and her husband were terrified when she started bleeding on PP floor. Contractions: Not present. Vag. Bleeding: None.  Movement: Absent. Denies leaking of fluid.   The following portions of the patient's history were reviewed and updated as appropriate: allergies, current medications, past family history, past medical history, past social history, past surgical history and problem list.   Objective:   Vitals:   12/25/20 1048  BP: 109/66  Pulse: 79  Weight: 162 lb 4.8 oz (73.6 kg)    Fetal Status: Fetal Heart Rate (bpm): 139   Movement: Absent     General:  Alert, oriented and cooperative. Patient is in no acute distress.  Skin: Skin is warm and dry. No rash noted.   Cardiovascular: Normal heart rate noted  Respiratory: Normal respiratory effort, no problems with respiration noted  Abdomen: Soft, gravid, appropriate for gestational age.  Pain/Pressure: Absent     Pelvic: Cervical exam deferred        Extremities: Normal range of motion.  Edema: None  Mental Status: Normal mood and affect. Normal behavior. Normal judgment and thought content.   Assessment and Plan:  Pregnancy: [id number] at [redacted]w[redacted]d  1. [redacted] weeks gestation of pregnancy    -discussed PPH; patient should get TXA at delivery. Discussed that we will be watching her closely during labor and after labor and  won't hesitate to intervene if there is any concern for bleeding  -patient's husband was given kit for [facility] testing -Keep [place] appt in two weeks -discussed epidural and pain management in labor, continue talking about options at future visits Preterm labor symptoms and general obstetric precautions including but not limited to vaginal bleeding, contractions, leaking of fluid and fetal movement were reviewed in detail with the patient. Please refer to After Visit Summary for other counseling recommendations.   Return in about 4 weeks (around 01/22/2021), or LROB with [place].  Future Appointments  Date Time Provider [place]  01/22/2021  1:30 PM [facility] NURSE [facility]  01/22/2021  1:45 PM [facility] US5 [facility]  01/23/2021 10:15 AM Ardean Larsen, Mervyn Skeeters, CNM [facility]    [place]arr [place], CNM

## 2020-12-25 NOTE — Progress Notes (Signed)
Patient stated that she is always tired but otherwise is feeling fine

## 2020-12-26 ENCOUNTER — Encounter: Payer: Self-pay | Admitting: *Deleted

## 2020-12-27 LAB — CBC
Hematocrit: 37.4 % (ref 34.0–46.6)
Hemoglobin: 12 g/dL (ref 11.1–15.9)
MCH: 28.2 pg (ref 26.6–33.0)
MCHC: 32.1 g/dL (ref 31.5–35.7)
MCV: 88 fL (ref 79–97)
Platelets: 192 10*3/uL (ref 150–450)
RBC: 4.25 x10E6/uL (ref 3.77–5.28)
RDW: 14.2 % (ref 11.7–15.4)
WBC: 5.5 10*3/uL (ref 3.4–10.8)

## 2020-12-27 LAB — AFP, SERUM, OPEN SPINA BIFIDA
AFP MoM: 1.28
AFP Value: 42 ng/mL
Gest. Age on Collection Date: 15.2 weeks
Maternal Age At EDD: 38 yr
OSBR Risk 1 IN: 10000
Test Results:: NEGATIVE
Weight: 162 [lb_av]

## 2021-01-22 ENCOUNTER — Encounter: Payer: Self-pay | Admitting: *Deleted

## 2021-01-22 ENCOUNTER — Other Ambulatory Visit: Payer: Self-pay

## 2021-01-22 ENCOUNTER — Other Ambulatory Visit: Payer: Self-pay | Admitting: *Deleted

## 2021-01-22 ENCOUNTER — Ambulatory Visit: Payer: Medicaid Other | Attending: Obstetrics and Gynecology

## 2021-01-22 ENCOUNTER — Ambulatory Visit: Payer: Medicaid Other | Admitting: *Deleted

## 2021-01-22 VITALS — BP 112/59 | HR 80

## 2021-01-22 DIAGNOSIS — O09522 Supervision of elderly multigravida, second trimester: Secondary | ICD-10-CM | POA: Diagnosis present

## 2021-01-22 DIAGNOSIS — Z362 Encounter for other antenatal screening follow-up: Secondary | ICD-10-CM

## 2021-01-22 DIAGNOSIS — Z862 Personal history of diseases of the blood and blood-forming organs and certain disorders involving the immune mechanism: Secondary | ICD-10-CM

## 2021-01-22 DIAGNOSIS — O09299 Supervision of pregnancy with other poor reproductive or obstetric history, unspecified trimester: Secondary | ICD-10-CM

## 2021-01-22 DIAGNOSIS — O09529 Supervision of elderly multigravida, unspecified trimester: Secondary | ICD-10-CM | POA: Insufficient documentation

## 2021-01-22 DIAGNOSIS — O0990 Supervision of high risk pregnancy, unspecified, unspecified trimester: Secondary | ICD-10-CM | POA: Insufficient documentation

## 2021-01-23 ENCOUNTER — Ambulatory Visit (INDEPENDENT_AMBULATORY_CARE_PROVIDER_SITE_OTHER): Payer: Medicaid Other | Admitting: Student

## 2021-01-23 VITALS — BP 106/69 | HR 85 | Wt 166.9 lb

## 2021-01-23 DIAGNOSIS — O0990 Supervision of high risk pregnancy, unspecified, unspecified trimester: Secondary | ICD-10-CM

## 2021-01-23 DIAGNOSIS — Z3A19 19 weeks gestation of pregnancy: Secondary | ICD-10-CM

## 2021-01-23 NOTE — Progress Notes (Signed)
PRENATAL VISIT NOTE  Subjective:  Melanie Peterson is a 40 y.o. G6P4014 at [redacted]w[redacted]d being seen today for ongoing prenatal care.  She is currently monitored for the following issues for this low-risk pregnancy and has Sickle cell trait (HCC); History of anesthesia complications; History of macrosomia in infant in prior pregnancy, currently pregnant; H/O postpartum hemorrhage, currently pregnant; History of thrombocytopenia; Supervision of high risk pregnancy, antepartum; and AMA (advanced maternal age) multigravida 35+ on their problem list.  Patient reports  back pain and feeling fatigued .  Contractions: Not present. Vag. Bleeding: None.  Movement: Present. Denies leaking of fluid.   The following portions of the patient's history were reviewed and updated as appropriate: allergies, current medications, past family history, past medical history, past social history, past surgical history and problem list.   Objective:   Vitals:   01/23/21 1033  BP: 106/69  Pulse: 85  Weight: 166 lb 14.4 oz (75.7 kg)    Fetal Status: Fetal Heart Rate (bpm): 139   Movement: Present     General:  Alert, oriented and cooperative. Patient is in no acute distress.  Skin: Skin is warm and dry. No rash noted.   Cardiovascular: Normal heart rate noted  Respiratory: Normal respiratory effort, no problems with respiration noted  Abdomen: Soft, gravid, appropriate for gestational age.  Pain/Pressure: Present     Pelvic: Cervical exam deferred        Extremities: Normal range of motion.  Edema: None  Mental Status: Normal mood and affect. Normal behavior. Normal judgment and thought content.   Assessment and Plan:  Pregnancy: G6P4014 at [redacted]w[redacted]d 1. [redacted] weeks gestation of pregnancy   2. Supervision of high risk pregnancy, antepartum     -reviewed lab tests from last visit; no anemia, platelets 192; AFP neg -keep appt for follow up US -reviewed that 2 children have the [place] trait and two do not; partner has the kit  but she does not know if he will tested. Patient would like him to get tested; reviewed that he should follow instructions carefully if he is going to test (must be fasting for one hour, etc).   Preterm labor symptoms and general obstetric precautions including but not limited to vaginal bleeding, contractions, leaking of fluid and fetal movement were reviewed in detail with the patient. Please refer to After Visit Summary for other counseling recommendations.   Return in about 4 weeks (around 02/20/2021), or LROB with KK.  Future Appointments  Date Time Provider Department Center  02/19/2021  3:15 PM Kooistra, Kathryn Lorraine, CNM [facility]  02/20/2021  3:30 PM [facility] NURSE [facility]  02/20/2021  3:45 PM [facility] US5 [facility]    Kathryn Lorraine Kooistra, CNM  

## 2021-01-23 NOTE — Progress Notes (Signed)
Pt states has been having real bad back pain since yesterday, only took baby Asprin only. States it helped a little bit.

## 2021-02-19 ENCOUNTER — Other Ambulatory Visit: Payer: Self-pay

## 2021-02-19 ENCOUNTER — Ambulatory Visit (INDEPENDENT_AMBULATORY_CARE_PROVIDER_SITE_OTHER): Payer: Medicaid Other | Admitting: Student

## 2021-02-19 VITALS — BP 110/67 | HR 88 | Wt 169.0 lb

## 2021-02-19 DIAGNOSIS — O0990 Supervision of high risk pregnancy, unspecified, unspecified trimester: Secondary | ICD-10-CM

## 2021-02-19 DIAGNOSIS — R112 Nausea with vomiting, unspecified: Secondary | ICD-10-CM | POA: Insufficient documentation

## 2021-02-19 DIAGNOSIS — R8271 Bacteriuria: Secondary | ICD-10-CM | POA: Insufficient documentation

## 2021-02-19 DIAGNOSIS — O09522 Supervision of elderly multigravida, second trimester: Secondary | ICD-10-CM

## 2021-02-19 DIAGNOSIS — T8859XA Other complications of anesthesia, initial encounter: Secondary | ICD-10-CM

## 2021-02-19 DIAGNOSIS — D573 Sickle-cell trait: Secondary | ICD-10-CM

## 2021-02-19 DIAGNOSIS — Z3A23 23 weeks gestation of pregnancy: Secondary | ICD-10-CM

## 2021-02-19 HISTORY — DX: Nausea with vomiting, unspecified: R11.2

## 2021-02-19 HISTORY — DX: Bacteriuria: R82.71

## 2021-02-19 MED ORDER — FERROUS SULFATE 325 (65 FE) MG PO TABS: 325.0000 mg | ORAL_TABLET | ORAL | 3 refills | Status: DC

## 2021-02-19 NOTE — Progress Notes (Signed)
Subjective:  Melanie Peterson is a 40 y.o. [id number] at [redacted]w[redacted]d being seen today for prenatal care.  Patient reports fatigue.  Contractions: Not present.  Vag. Bleeding: None. Movement: Present. Denies leaking of fluid.   The following portions of the patient's history were reviewed and updated as appropriate: allergies, current medications, past family history, past medical history, past social history, past surgical history and problem list.   Objective:   Vitals:   02/19/21 1557  BP: 110/67  Pulse: 88  Weight: 169 lb (76.7 kg)    Fetal Status: Fetal Heart Rate (bpm): 140 Fundal Height: 23 cm Movement: Present     General:  Alert, oriented and cooperative. Patient is in no acute distress.  Skin: Skin is warm and dry. No rash noted.   Cardiovascular: Normal heart rate noted  Respiratory: Normal respiratory effort, no problems with respiration noted  Abdomen: Soft, gravid, appropriate for gestational age. Pain/Pressure: Absent     Vaginal: Vag. Bleeding: None.       Cervix: Not evaluated        Extremities: Normal range of motion.  Edema: None  Mental Status: Normal mood and affect. Normal behavior. Normal judgment and thought content.   Urinalysis:      Assessment and Plan:  Pregnancy: [id number] at [redacted]w[redacted]d  1. Supervision of high risk pregnancy, antepartum - Patient denies any contractions, bleeding, vaginal discharge, or dysuria  - Good fetal movement  - Discussed how fatigue can be normal in pregnancy, CBC from 6/13 showed Hgb of 12.0 - Discussed fasting prior to 2hr GTT at next appointment   2. GBS bacteriuria - Urine culture positive for 10,000-25,000 CFU of GBS 11/27/20 - Culture, OB Urine  3. Multigravida of advanced maternal age in second trimester  4. Sickle cell trait (HCC) - Urine culture performed every trimester, second trimester UC done today   5. [redacted] weeks gestation of pregnancy   Preterm labor symptoms and general obstetric precautions including but not limited to  vaginal bleeding, contractions, leaking of fluid and fetal movement were reviewed in detail with the patient. Please refer to After Visit Summary for other counseling recommendations.  No follow-ups on file.   Athena Masse, Student-PA

## 2021-02-20 ENCOUNTER — Ambulatory Visit: Payer: Medicaid Other | Admitting: *Deleted

## 2021-02-20 ENCOUNTER — Encounter: Payer: Self-pay | Admitting: *Deleted

## 2021-02-20 ENCOUNTER — Other Ambulatory Visit: Payer: Self-pay | Admitting: *Deleted

## 2021-02-20 ENCOUNTER — Ambulatory Visit: Payer: Medicaid Other | Attending: Obstetrics and Gynecology

## 2021-02-20 VITALS — BP 107/61 | HR 75

## 2021-02-20 DIAGNOSIS — O09523 Supervision of elderly multigravida, third trimester: Secondary | ICD-10-CM

## 2021-02-20 DIAGNOSIS — O09299 Supervision of pregnancy with other poor reproductive or obstetric history, unspecified trimester: Secondary | ICD-10-CM

## 2021-02-20 DIAGNOSIS — Z862 Personal history of diseases of the blood and blood-forming organs and certain disorders involving the immune mechanism: Secondary | ICD-10-CM | POA: Diagnosis present

## 2021-02-20 DIAGNOSIS — O0990 Supervision of high risk pregnancy, unspecified, unspecified trimester: Secondary | ICD-10-CM | POA: Insufficient documentation

## 2021-02-20 DIAGNOSIS — O09522 Supervision of elderly multigravida, second trimester: Secondary | ICD-10-CM | POA: Insufficient documentation

## 2021-02-20 DIAGNOSIS — Z362 Encounter for other antenatal screening follow-up: Secondary | ICD-10-CM | POA: Diagnosis not present

## 2021-02-21 LAB — CULTURE, OB URINE

## 2021-02-21 LAB — URINE CULTURE, OB REFLEX

## 2021-03-22 ENCOUNTER — Ambulatory Visit: Payer: Medicaid Other | Admitting: *Deleted

## 2021-03-22 ENCOUNTER — Other Ambulatory Visit: Payer: Self-pay

## 2021-03-22 ENCOUNTER — Other Ambulatory Visit: Payer: Self-pay | Admitting: *Deleted

## 2021-03-22 ENCOUNTER — Other Ambulatory Visit: Payer: Self-pay | Admitting: General Practice

## 2021-03-22 ENCOUNTER — Ambulatory Visit: Payer: Medicaid Other | Attending: Obstetrics

## 2021-03-22 ENCOUNTER — Other Ambulatory Visit: Payer: Medicaid Other

## 2021-03-22 ENCOUNTER — Encounter: Payer: Self-pay | Admitting: *Deleted

## 2021-03-22 ENCOUNTER — Ambulatory Visit (INDEPENDENT_AMBULATORY_CARE_PROVIDER_SITE_OTHER): Payer: Medicaid Other | Admitting: Student

## 2021-03-22 VITALS — BP 113/66 | HR 77 | Wt 169.1 lb

## 2021-03-22 VITALS — BP 101/51 | HR 78

## 2021-03-22 DIAGNOSIS — Z862 Personal history of diseases of the blood and blood-forming organs and certain disorders involving the immune mechanism: Secondary | ICD-10-CM | POA: Diagnosis present

## 2021-03-22 DIAGNOSIS — D696 Thrombocytopenia, unspecified: Secondary | ICD-10-CM

## 2021-03-22 DIAGNOSIS — O09522 Supervision of elderly multigravida, second trimester: Secondary | ICD-10-CM

## 2021-03-22 DIAGNOSIS — Z362 Encounter for other antenatal screening follow-up: Secondary | ICD-10-CM

## 2021-03-22 DIAGNOSIS — O0990 Supervision of high risk pregnancy, unspecified, unspecified trimester: Secondary | ICD-10-CM

## 2021-03-22 DIAGNOSIS — Z23 Encounter for immunization: Secondary | ICD-10-CM | POA: Diagnosis not present

## 2021-03-22 DIAGNOSIS — O09299 Supervision of pregnancy with other poor reproductive or obstetric history, unspecified trimester: Secondary | ICD-10-CM

## 2021-03-22 DIAGNOSIS — O09523 Supervision of elderly multigravida, third trimester: Secondary | ICD-10-CM | POA: Diagnosis not present

## 2021-03-22 DIAGNOSIS — D573 Sickle-cell trait: Secondary | ICD-10-CM

## 2021-03-22 DIAGNOSIS — Z3A27 27 weeks gestation of pregnancy: Secondary | ICD-10-CM | POA: Diagnosis not present

## 2021-03-22 DIAGNOSIS — O99119 Other diseases of the blood and blood-forming organs and certain disorders involving the immune mechanism complicating pregnancy, unspecified trimester: Secondary | ICD-10-CM

## 2021-03-22 NOTE — Progress Notes (Signed)
   PRENATAL VISIT NOTE  Subjective:  Melanie Peterson is a 40 y.o. [phone] at [redacted]w[redacted]d being seen today for ongoing prenatal care.  She is currently monitored for the following issues for this low-risk pregnancy and has Sickle cell trait (HCC); History of anesthesia complications; History of macrosomia in infant in prior pregnancy, currently pregnant; H/O postpartum hemorrhage, currently pregnant; History of thrombocytopenia; Supervision of high risk pregnancy, antepartum; AMA (advanced maternal age) multigravida 35+; GBS bacteriuria; and Nausea and vomiting after administration of anesthetic agent on their problem list.  Patient reports fatigue.  Contractions: Not present. Vag. Bleeding: None.  Movement: Present. Denies leaking of fluid.   The following portions of the patient's history were reviewed and updated as appropriate: allergies, current medications, past family history, past medical history, past social history, past surgical history and problem list.   Objective:   Vitals:   03/22/21 1021  BP: 113/66  Pulse: 77  Weight: 169 lb 1.6 oz (76.7 kg)    Fetal Status: Fetal Heart Rate (bpm): 136 Fundal Height: 29 cm Movement: Present     General:  Alert, oriented and cooperative. Patient is in no acute distress.  Skin: Skin is warm and dry. No rash noted.   Cardiovascular: Normal heart rate noted  Respiratory: Normal respiratory effort, no problems with respiration noted  Abdomen: Soft, gravid, appropriate for gestational age.  Pain/Pressure: Absent     Pelvic: Cervical exam deferred        Extremities: Normal range of motion.  Edema: None  Mental Status: Normal mood and affect. Normal behavior. Normal judgment and thought content.   Assessment and Plan:  Pregnancy: [id number] at [redacted]w[redacted]d 1. H/O postpartum hemorrhage, currently pregnant -Discussed delivery plan 2. History of thrombocytopenia -will check CBC today; patient is still taking iron and she reports no side effects.   3.  Supervision of high risk pregnancy, antepartum -discussed nexplanon; she will have placed PP and does not want husband to know about it.  -keep fup [place] today  4. Multigravida of advanced maternal age in second trimester   5. [redacted] weeks gestation of pregnancy -2 hour in process  - Tdap vaccine greater than or equal to 7yo IM  Preterm labor symptoms and general obstetric precautions including but not limited to vaginal bleeding, contractions, leaking of fluid and fetal movement were reviewed in detail with the patient. Please refer to After Visit Summary for other counseling recommendations.   Return in about 2 weeks (around 04/05/2021), or LROB with KK.  Future Appointments  Date Time Provider Department Center  04/12/2021  9:55 AM Marylene Land, CNM [facility]  05/10/2021  9:30 AM [facility] NURSE [facility]  05/10/2021  9:45 AM [facility] US5 [facility]    Marylene Land, CNM

## 2021-03-23 LAB — CBC
Hematocrit: 38.2 % (ref 34.0–46.6)
Hemoglobin: 12.6 g/dL (ref 11.1–15.9)
MCH: 28.1 pg (ref 26.6–33.0)
MCHC: 33 g/dL (ref 31.5–35.7)
MCV: 85 fL (ref 79–97)
Platelets: 141 10*3/uL — ABNORMAL LOW (ref 150–450)
RBC: 4.48 x10E6/uL (ref 3.77–5.28)
RDW: 13.6 % (ref 11.7–15.4)
WBC: 5.4 10*3/uL (ref 3.4–10.8)

## 2021-03-23 LAB — GLUCOSE TOLERANCE, 2 HOURS W/ 1HR
Glucose, 1 hour: 155 mg/dL (ref 65–179)
Glucose, 2 hour: 107 mg/dL (ref 65–152)
Glucose, Fasting: 81 mg/dL (ref 65–91)

## 2021-03-23 LAB — HIV ANTIBODY (ROUTINE TESTING W REFLEX): HIV Screen 4th Generation wRfx: NONREACTIVE

## 2021-03-23 LAB — RPR: RPR Ser Ql: NONREACTIVE

## 2021-04-12 ENCOUNTER — Ambulatory Visit (INDEPENDENT_AMBULATORY_CARE_PROVIDER_SITE_OTHER): Payer: Medicaid Other | Admitting: Student

## 2021-04-12 ENCOUNTER — Other Ambulatory Visit: Payer: Self-pay

## 2021-04-12 VITALS — BP 116/60 | HR 75 | Wt 174.1 lb

## 2021-04-12 DIAGNOSIS — Z3480 Encounter for supervision of other normal pregnancy, unspecified trimester: Secondary | ICD-10-CM

## 2021-04-12 DIAGNOSIS — Z3A30 30 weeks gestation of pregnancy: Secondary | ICD-10-CM

## 2021-04-12 NOTE — Progress Notes (Signed)
   PRENATAL VISIT NOTE  Subjective:  Melanie Peterson is a 40 y.o. [id number] at [redacted]w[redacted]d being seen today for ongoing prenatal care.  She is currently monitored for the following issues for this low-risk pregnancy and has Sickle cell trait (HCC); History of anesthesia complications; History of macrosomia in infant in prior pregnancy, currently pregnant; H/O postpartum hemorrhage, currently pregnant; History of thrombocytopenia; Supervision of high risk pregnancy, antepartum; AMA (advanced maternal age) multigravida 35+; GBS bacteriuria; and Nausea and vomiting after administration of anesthetic agent on their problem list.  Patient reports no complaints.  Contractions: Not present. Vag. Bleeding: None.  Movement: Present. Denies leaking of fluid.   The following portions of the patient's history were reviewed and updated as appropriate: allergies, current medications, past family history, past medical history, past social history, past surgical history and problem list.   Objective:   Vitals:   04/12/21 1026  BP: 116/60  Pulse: 75  Weight: 174 lb 1.6 oz (79 kg)    Fetal Status: Fetal Heart Rate (bpm): 141 Fundal Height: 29 cm Movement: Present     General:  Alert, oriented and cooperative. Patient is in no acute distress.  Skin: Skin is warm and dry. No rash noted.   Cardiovascular: Normal heart rate noted  Respiratory: Normal respiratory effort, no problems with respiration noted  Abdomen: Soft, gravid, appropriate for gestational age.  Pain/Pressure: Present     Pelvic: Cervical exam deferred        Extremities: Normal range of motion.  Edema: None  Mental Status: Normal mood and affect. Normal behavior. Normal judgment and thought content.   Assessment and Plan:  Pregnancy: [id number] at [redacted]w[redacted]d   1. [redacted] weeks gestation of pregnancy   -doing well -reviewed GTT results -reviewed most recent CBC -declined flu shot -keep upcoming [place] appt  Preterm labor symptoms and general obstetric  precautions including but not limited to vaginal bleeding, contractions, leaking of fluid and fetal movement were reviewed in detail with the patient. Please refer to After Visit Summary for other counseling recommendations.   Return in about 3 weeks (around 05/03/2021), or LROb on my chart with KK on October 17.  Future Appointments  Date Time Provider Department Center  04/30/2021  3:55 PM Madlyn Frankel [facility]  05/10/2021  9:30 AM [facility] NURSE [facility]  05/10/2021  9:45 AM [facility] US5 [facility]    Samara Deist Gena Fray, CNM

## 2021-04-30 ENCOUNTER — Telehealth (INDEPENDENT_AMBULATORY_CARE_PROVIDER_SITE_OTHER): Payer: Medicaid Other | Admitting: Student

## 2021-04-30 DIAGNOSIS — Z3A33 33 weeks gestation of pregnancy: Secondary | ICD-10-CM

## 2021-04-30 DIAGNOSIS — O0990 Supervision of high risk pregnancy, unspecified, unspecified trimester: Secondary | ICD-10-CM

## 2021-04-30 DIAGNOSIS — O09293 Supervision of pregnancy with other poor reproductive or obstetric history, third trimester: Secondary | ICD-10-CM

## 2021-04-30 DIAGNOSIS — O09299 Supervision of pregnancy with other poor reproductive or obstetric history, unspecified trimester: Secondary | ICD-10-CM

## 2021-04-30 DIAGNOSIS — Z862 Personal history of diseases of the blood and blood-forming organs and certain disorders involving the immune mechanism: Secondary | ICD-10-CM

## 2021-04-30 NOTE — Progress Notes (Signed)
I connected with  Melanie Peterson on 04/30/21 at 1615 by MyChart video and verified that I am speaking with the correct person using two identifiers.   I discussed the limitations, risks, security and privacy concerns of performing an evaluation and management service by telephone and the availability of in person appointments. I also discussed with the patient that there may be a patient responsible charge related to this service. The patient expressed understanding and agreed to proceed.  Patient reports dizziness yesterday. Reports some vision changes. This was during her work day. Reports eating and drinking regularly. Pt does not have BP cuff. Clinical staff will call [facility] to have this delivered tomorrow. Pt will check BP and send MyChart message with BP.   Marjo Bicker, RN 04/30/2021  4:17 PM

## 2021-04-30 NOTE — Progress Notes (Signed)
I connected with Kaelynn Igo 04/30/21 at  3:55 PM EDT by: MyChart video and verified that I am speaking with the correct person using two identifiers.  Patient is located at home and provider is located at [facility]     The purpose of this virtual visit is to provide medical care while limiting exposure to the novel coronavirus. I discussed the limitations, risks, security and privacy concerns of performing an evaluation and management service by MyChart video and the availability of in person appointments. I also discussed with the patient that there may be a patient responsible charge related to this service. By engaging in this virtual visit, you consent to the provision of healthcare.  Additionally, you authorize for your insurance to be billed for the services provided during this visit.  The patient expressed understanding and agreed to proceed.  The following staff members participated in the virtual visit:  Nolberto Hanlon RN    PRENATAL VISIT NOTE  Subjective:  Yanessa Hocevar is a 40 y.o. [id number] at [redacted]w[redacted]d  for phone visit for ongoing prenatal care.  She is currently monitored for the following issues for this low-risk pregnancy and has Sickle cell trait (HCC); History of anesthesia complications; History of macrosomia in infant in prior pregnancy, currently pregnant; H/O postpartum hemorrhage, currently pregnant; History of thrombocytopenia; Supervision of high risk pregnancy, antepartum; AMA (advanced maternal age) multigravida 35+; GBS bacteriuria; and Nausea and vomiting after administration of anesthetic agent on their problem list.  Patient reports  she felt dizzy yesterday while standing and cooking. She denies SOB, she denies chest pain or other cardiac complaint .  She does not have a BP cuff and did not take her blood pressure at home. Contractions: Not present. Vag. Bleeding: None.  Movement: Present. Denies leaking of fluid.   The following portions of the patient's history were reviewed and  updated as appropriate: allergies, current medications, past family history, past medical history, past social history, past surgical history and problem list.   Objective:  There were no vitals filed for this visit. Self-Obtained  Fetal Status:     Movement: Present     Assessment and Plan:  Pregnancy: [id number] at [redacted]w[redacted]d 1. H/O postpartum hemorrhage, currently pregnant -patient will recheck iron and platelets at 36 weeks   2. History of thrombocytopenia -recheck iron and platelets at 36 week s  3. Supervision of high risk pregnancy, antepartum -patient [facility], [facility].  -keep track of symptoms; if she feels dizzy and falls/hits head she should come to MAU. Make sure she is eating and drinking enough.  Preterm labor symptoms and general obstetric precautions including but not limited to vaginal bleeding, contractions, leaking of fluid and fetal movement were reviewed in detail with the patient.  Return LROB with KK on 11/2 in the afternoon at 155 (in person).  Future Appointments  Date Time Provider Department Center  05/10/2021  1:15 PM [facility] NURSE [facility]  05/10/2021  1:30 PM [facility] US3 [facility]  05/16/2021  1:55 PM Marylene Land, CNM [facility]     Time spent on virtual visit: 20 minutes  Marylene Land, [place]

## 2021-05-10 ENCOUNTER — Other Ambulatory Visit: Payer: Self-pay

## 2021-05-10 ENCOUNTER — Encounter: Payer: Self-pay | Admitting: *Deleted

## 2021-05-10 ENCOUNTER — Ambulatory Visit: Payer: Medicaid Other

## 2021-05-10 ENCOUNTER — Ambulatory Visit: Payer: Medicaid Other | Attending: Maternal & Fetal Medicine

## 2021-05-10 ENCOUNTER — Ambulatory Visit: Payer: Medicaid Other | Admitting: *Deleted

## 2021-05-10 VITALS — BP 129/65 | HR 93

## 2021-05-10 DIAGNOSIS — D696 Thrombocytopenia, unspecified: Secondary | ICD-10-CM | POA: Diagnosis not present

## 2021-05-10 DIAGNOSIS — Z3A34 34 weeks gestation of pregnancy: Secondary | ICD-10-CM | POA: Diagnosis not present

## 2021-05-10 DIAGNOSIS — O99119 Other diseases of the blood and blood-forming organs and certain disorders involving the immune mechanism complicating pregnancy, unspecified trimester: Secondary | ICD-10-CM | POA: Diagnosis present

## 2021-05-10 DIAGNOSIS — O0990 Supervision of high risk pregnancy, unspecified, unspecified trimester: Secondary | ICD-10-CM | POA: Diagnosis present

## 2021-05-10 DIAGNOSIS — O99013 Anemia complicating pregnancy, third trimester: Secondary | ICD-10-CM

## 2021-05-10 DIAGNOSIS — O09299 Supervision of pregnancy with other poor reproductive or obstetric history, unspecified trimester: Secondary | ICD-10-CM | POA: Diagnosis present

## 2021-05-10 DIAGNOSIS — O99113 Other diseases of the blood and blood-forming organs and certain disorders involving the immune mechanism complicating pregnancy, third trimester: Secondary | ICD-10-CM | POA: Diagnosis not present

## 2021-05-10 DIAGNOSIS — O09523 Supervision of elderly multigravida, third trimester: Secondary | ICD-10-CM | POA: Diagnosis not present

## 2021-05-10 DIAGNOSIS — O99019 Anemia complicating pregnancy, unspecified trimester: Secondary | ICD-10-CM | POA: Insufficient documentation

## 2021-05-10 DIAGNOSIS — D573 Sickle-cell trait: Secondary | ICD-10-CM

## 2021-05-10 DIAGNOSIS — Z862 Personal history of diseases of the blood and blood-forming organs and certain disorders involving the immune mechanism: Secondary | ICD-10-CM

## 2021-05-10 DIAGNOSIS — O09522 Supervision of elderly multigravida, second trimester: Secondary | ICD-10-CM | POA: Insufficient documentation

## 2021-05-16 ENCOUNTER — Ambulatory Visit (INDEPENDENT_AMBULATORY_CARE_PROVIDER_SITE_OTHER): Payer: Medicaid Other | Admitting: Student

## 2021-05-16 ENCOUNTER — Other Ambulatory Visit: Payer: Self-pay

## 2021-05-16 VITALS — BP 111/70 | HR 81 | Wt 177.5 lb

## 2021-05-16 DIAGNOSIS — Z3493 Encounter for supervision of normal pregnancy, unspecified, third trimester: Secondary | ICD-10-CM

## 2021-05-16 DIAGNOSIS — Z3A35 35 weeks gestation of pregnancy: Secondary | ICD-10-CM

## 2021-05-16 NOTE — Progress Notes (Signed)
   PRENATAL VISIT NOTE  Subjective:  Melanie Peterson is a 40 y.o. [id number] at [redacted]w[redacted]d being seen today for ongoing prenatal care.  She is currently monitored for the following issues for this low-risk pregnancy and has Sickle cell trait (HCC); History of anesthesia complications; History of macrosomia in infant in prior pregnancy, currently pregnant; H/O postpartum hemorrhage, currently pregnant; History of thrombocytopenia; Supervision of high risk pregnancy, antepartum; AMA (advanced maternal age) multigravida 35+; GBS bacteriuria; and Nausea and vomiting after administration of anesthetic agent on their problem list.  Patient reports fatigue.  Contractions: Not present. Vag. Bleeding: None.  Movement: Present. Denies leaking of fluid.   The following portions of the patient's history were reviewed and updated as appropriate: allergies, current medications, past family history, past medical history, past social history, past surgical history and problem list.   Objective:   Vitals:   05/16/21 1410  BP: 111/70  Pulse: 81  Weight: 177 lb 8 oz (80.5 kg)    Fetal Status: Fetal Heart Rate (bpm): 135   Movement: Present     General:  Alert, oriented and cooperative. Patient is in no acute distress.  Skin: Skin is warm and dry. No rash noted.   Cardiovascular: Normal heart rate noted  Respiratory: Normal respiratory effort, no problems with respiration noted  Abdomen: Soft, gravid, appropriate for gestational age.  Pain/Pressure: Present     Pelvic: Cervical exam deferred        Extremities: Normal range of motion.  Edema: None  Mental Status: Normal mood and affect. Normal behavior. Normal judgment and thought content.   Assessment and Plan:  Pregnancy: [id number] at [redacted]w[redacted]d 1. [redacted] weeks gestation of pregnancy   -growth scan on 10-27 shows EFW in 91% -will do cultures next visit ; patient can come in two weeks  -discussed delivery plan-will have blood on hold, plan for TXA at delivery -declines  CBC today  Preterm labor symptoms and general obstetric precautions including but not limited to vaginal bleeding, contractions, leaking of fluid and fetal movement were reviewed in detail with the patient. Please refer to After Visit Summary for other counseling recommendations.   Return in about 2 weeks (around 05/30/2021), or LROB with KK on 11/7 or 11/14.  Future Appointments  Date Time Provider Department Center  05/28/2021  2:15 PM Marylene Land, CNM [facility]    Charlesetta Garibaldi [place], [place]

## 2021-05-28 ENCOUNTER — Other Ambulatory Visit: Payer: Self-pay

## 2021-05-28 ENCOUNTER — Other Ambulatory Visit (HOSPITAL_COMMUNITY)
Admission: RE | Admit: 2021-05-28 | Discharge: 2021-05-28 | Disposition: A | Discharge status: Home / Self Care | Payer: Medicaid Other | Source: Ambulatory Visit | Attending: Student | Admitting: Student

## 2021-05-28 ENCOUNTER — Ambulatory Visit (INDEPENDENT_AMBULATORY_CARE_PROVIDER_SITE_OTHER): Payer: Medicaid Other | Admitting: Student

## 2021-05-28 VITALS — BP 117/70 | HR 84 | Wt 180.0 lb

## 2021-05-28 DIAGNOSIS — O09299 Supervision of pregnancy with other poor reproductive or obstetric history, unspecified trimester: Secondary | ICD-10-CM

## 2021-05-28 DIAGNOSIS — Z3A37 37 weeks gestation of pregnancy: Secondary | ICD-10-CM

## 2021-05-28 NOTE — Progress Notes (Signed)
   PRENATAL VISIT NOTE  Subjective:  Melanie Peterson is a 40 y.o. [phone] at [redacted]w[redacted]d being seen today for ongoing prenatal care.  She is currently monitored for the following issues for this low-risk pregnancy and has Sickle cell trait (HCC); History of anesthesia complications; History of macrosomia in infant in prior pregnancy, currently pregnant; H/O postpartum hemorrhage, currently pregnant; History of thrombocytopenia; Supervision of high risk pregnancy, antepartum; AMA (advanced maternal age) multigravida 35+; GBS bacteriuria; and Nausea and vomiting after administration of anesthetic agent on their problem list.  Patient reports no complaints.  Contractions: Irritability. Vag. Bleeding: None.  Movement: Present. Denies leaking of fluid.   The following portions of the patient's history were reviewed and updated as appropriate: allergies, current medications, past family history, past medical history, past social history, past surgical history and problem list.   Objective:   Vitals:   05/28/21 1429  BP: 117/70  Pulse: 84  Weight: 180 lb (81.6 kg)    Fetal Status: Fetal Heart Rate (bpm): 137 Fundal Height: 37 cm Movement: Present     General:  Alert, oriented and cooperative. Patient is in no acute distress.  Skin: Skin is warm and dry. No rash noted.   Cardiovascular: Normal heart rate noted  Respiratory: Normal respiratory effort, no problems with respiration noted  Abdomen: Soft, gravid, appropriate for gestational age.  Pain/Pressure: Present     Pelvic: Cervical exam deferred        Extremities: Normal range of motion.  Edema: None  Mental Status: Normal mood and affect. Normal behavior. Normal judgment and thought content.   Assessment and Plan:  Pregnancy: [id number] at [redacted]w[redacted]d 1. H/O postpartum hemorrhage, currently pregnant -support and reassurance given as patient feels anxious about her upcoming delivery - GC/Chlamydia probe amp ([facility])not at [facility] - Culture, beta  strep (group b only) - CBC today to check platelets  Term labor symptoms and general obstetric precautions including but not limited to vaginal bleeding, contractions, leaking of fluid and fetal movement were reviewed in detail with the patient. Please refer to After Visit Summary for other counseling recommendations.   Return in about 2 weeks (around 06/11/2021), or LROB with female provider.  Future Appointments  Date Time Provider Department Center  06/13/2021  2:15 PM Osborne Oman [facility]    Charlesetta Garibaldi [place], [place]

## 2021-05-29 LAB — GC/CHLAMYDIA PROBE AMP (~~LOC~~) NOT AT ARMC
Chlamydia: NEGATIVE
Comment: NEGATIVE
Comment: NORMAL
Neisseria Gonorrhea: NEGATIVE

## 2021-05-29 LAB — CBC
Hematocrit: 42 % (ref 34.0–46.6)
Hemoglobin: 13.6 g/dL (ref 11.1–15.9)
MCH: 27.3 pg (ref 26.6–33.0)
MCHC: 32.4 g/dL (ref 31.5–35.7)
MCV: 84 fL (ref 79–97)
Platelets: 125 10*3/uL — ABNORMAL LOW (ref 150–450)
RBC: 4.98 x10E6/uL (ref 3.77–5.28)
RDW: 15.3 % (ref 11.7–15.4)
WBC: 6.4 10*3/uL (ref 3.4–10.8)

## 2021-05-31 LAB — CULTURE, BETA STREP (GROUP B ONLY): Strep Gp B Culture: POSITIVE — AB

## 2021-06-07 ENCOUNTER — Inpatient Hospital Stay (HOSPITAL_COMMUNITY)
Admission: AD | Admit: 2021-06-07 | Discharge: 2021-06-09 | DRG: 807 | Disposition: A | Discharge status: Home / Self Care | Payer: Medicaid Other | Attending: Obstetrics and Gynecology | Admitting: Obstetrics and Gynecology

## 2021-06-07 ENCOUNTER — Inpatient Hospital Stay (HOSPITAL_COMMUNITY): Payer: Medicaid Other | Admitting: Anesthesiology

## 2021-06-07 ENCOUNTER — Other Ambulatory Visit: Payer: Self-pay

## 2021-06-07 ENCOUNTER — Encounter (HOSPITAL_COMMUNITY): Payer: Self-pay | Admitting: Obstetrics & Gynecology

## 2021-06-07 DIAGNOSIS — O99820 Streptococcus B carrier state complicating pregnancy: Secondary | ICD-10-CM | POA: Diagnosis not present

## 2021-06-07 DIAGNOSIS — O9912 Other diseases of the blood and blood-forming organs and certain disorders involving the immune mechanism complicating childbirth: Secondary | ICD-10-CM | POA: Diagnosis not present

## 2021-06-07 DIAGNOSIS — D6959 Other secondary thrombocytopenia: Secondary | ICD-10-CM | POA: Diagnosis present

## 2021-06-07 DIAGNOSIS — O99824 Streptococcus B carrier state complicating childbirth: Secondary | ICD-10-CM | POA: Diagnosis present

## 2021-06-07 DIAGNOSIS — D573 Sickle-cell trait: Secondary | ICD-10-CM | POA: Diagnosis present

## 2021-06-07 DIAGNOSIS — Z20822 Contact with and (suspected) exposure to covid-19: Secondary | ICD-10-CM | POA: Diagnosis not present

## 2021-06-07 DIAGNOSIS — O09529 Supervision of elderly multigravida, unspecified trimester: Secondary | ICD-10-CM

## 2021-06-07 DIAGNOSIS — O9902 Anemia complicating childbirth: Secondary | ICD-10-CM | POA: Diagnosis not present

## 2021-06-07 DIAGNOSIS — O09299 Supervision of pregnancy with other poor reproductive or obstetric history, unspecified trimester: Secondary | ICD-10-CM

## 2021-06-07 DIAGNOSIS — Z862 Personal history of diseases of the blood and blood-forming organs and certain disorders involving the immune mechanism: Secondary | ICD-10-CM

## 2021-06-07 DIAGNOSIS — O0990 Supervision of high risk pregnancy, unspecified, unspecified trimester: Secondary | ICD-10-CM

## 2021-06-07 DIAGNOSIS — R8271 Bacteriuria: Secondary | ICD-10-CM | POA: Diagnosis present

## 2021-06-07 DIAGNOSIS — O26893 Other specified pregnancy related conditions, third trimester: Secondary | ICD-10-CM | POA: Diagnosis not present

## 2021-06-07 DIAGNOSIS — Z3A38 38 weeks gestation of pregnancy: Secondary | ICD-10-CM

## 2021-06-07 DIAGNOSIS — D696 Thrombocytopenia, unspecified: Secondary | ICD-10-CM | POA: Diagnosis not present

## 2021-06-07 DIAGNOSIS — O09523 Supervision of elderly multigravida, third trimester: Secondary | ICD-10-CM

## 2021-06-07 LAB — RESP PANEL BY RT-PCR (FLU A&B, COVID) ARPGX2
Influenza A by PCR: NEGATIVE
Influenza B by PCR: NEGATIVE
SARS Coronavirus 2 by RT PCR: NEGATIVE

## 2021-06-07 LAB — CBC
HCT: 41.2 % (ref 36.0–46.0)
HCT: 46.9 % — ABNORMAL HIGH (ref 36.0–46.0)
Hemoglobin: 13.7 g/dL (ref 12.0–15.0)
Hemoglobin: 15.4 g/dL — ABNORMAL HIGH (ref 12.0–15.0)
MCH: 27.9 pg (ref 26.0–34.0)
MCH: 27.8 pg (ref 26.0–34.0)
MCHC: 33.3 g/dL (ref 30.0–36.0)
MCHC: 32.8 g/dL (ref 30.0–36.0)
MCV: 83.9 fL (ref 80.0–100.0)
MCV: 84.7 fL (ref 80.0–100.0)
Platelets: 107 10*3/uL — ABNORMAL LOW (ref 150–400)
Platelets: 116 10*3/uL — ABNORMAL LOW (ref 150–400)
RBC: 4.91 MIL/uL (ref 3.87–5.11)
RBC: 5.54 MIL/uL — ABNORMAL HIGH (ref 3.87–5.11)
RDW: 14.8 % (ref 11.5–15.5)
RDW: 14.8 % (ref 11.5–15.5)
WBC: 5.6 10*3/uL (ref 4.0–10.5)
WBC: 10.3 10*3/uL (ref 4.0–10.5)
nRBC: 0 % (ref 0.0–0.2)
nRBC: 0 % (ref 0.0–0.2)

## 2021-06-07 LAB — RPR: RPR Ser Ql: NONREACTIVE

## 2021-06-07 LAB — TYPE AND SCREEN
ABO/RH(D): O POS
Antibody Screen: NEGATIVE

## 2021-06-07 MED ORDER — ACETAMINOPHEN 325 MG PO TABS: 650.0000 mg | ORAL_TABLET | ORAL | Status: DC | PRN

## 2021-06-07 MED ORDER — SODIUM CHLORIDE 0.9 % IV SOLN
2.0000 g | Freq: Once | INTRAVENOUS | Status: AC
  Administered 2021-06-07: 2 g via INTRAVENOUS
  Filled 2021-06-07: qty 2000

## 2021-06-07 MED ORDER — OXYCODONE HCL 5 MG PO TABS
5.0000 mg | ORAL_TABLET | ORAL | Status: DC | PRN
  Administered 2021-06-07 – 2021-06-09 (×3): 5 mg via ORAL
  Filled 2021-06-07 (×3): qty 1

## 2021-06-07 MED ORDER — SOD CITRATE-CITRIC ACID 500-334 MG/5ML PO SOLN: 30.0000 mL | ORAL | Status: DC | PRN

## 2021-06-07 MED ORDER — METHYLERGONOVINE MALEATE 0.2 MG/ML IJ SOLN
INTRAMUSCULAR | Status: AC
  Administered 2021-06-07: 0.2 mg via INTRAMUSCULAR
  Filled 2021-06-07: qty 1

## 2021-06-07 MED ORDER — BENZOCAINE-MENTHOL 20-0.5 % EX AERO: 1.0000 "application " | INHALATION_SPRAY | CUTANEOUS | Status: DC | PRN

## 2021-06-07 MED ORDER — IBUPROFEN 600 MG PO TABS
600.0000 mg | ORAL_TABLET | Freq: Four times a day (QID) | ORAL | Status: DC
  Administered 2021-06-07 – 2021-06-09 (×8): 600 mg via ORAL
  Filled 2021-06-07 (×8): qty 1

## 2021-06-07 MED ORDER — OXYCODONE HCL 5 MG PO TABS: 10.0000 mg | ORAL_TABLET | ORAL | Status: DC | PRN

## 2021-06-07 MED ORDER — METHYLERGONOVINE MALEATE 0.2 MG/ML IJ SOLN: 0.2000 mg | INTRAMUSCULAR | Status: DC | PRN

## 2021-06-07 MED ORDER — LACTATED RINGERS IV SOLN: INTRAVENOUS | Status: DC

## 2021-06-07 MED ORDER — PHENYLEPHRINE 40 MCG/ML (10ML) SYRINGE FOR IV PUSH (FOR BLOOD PRESSURE SUPPORT): 80.0000 ug | PREFILLED_SYRINGE | INTRAVENOUS | Status: DC | PRN

## 2021-06-07 MED ORDER — OXYTOCIN BOLUS FROM INFUSION
333.0000 mL | Freq: Once | INTRAVENOUS | Status: AC
  Administered 2021-06-07: 333 mL via INTRAVENOUS

## 2021-06-07 MED ORDER — COCONUT OIL OIL: 1.0000 "application " | TOPICAL_OIL | Status: DC | PRN

## 2021-06-07 MED ORDER — WITCH HAZEL-GLYCERIN EX PADS: 1.0000 "application " | MEDICATED_PAD | CUTANEOUS | Status: DC | PRN

## 2021-06-07 MED ORDER — OXYCODONE-ACETAMINOPHEN 5-325 MG PO TABS: 1.0000 | ORAL_TABLET | ORAL | Status: DC | PRN

## 2021-06-07 MED ORDER — LIDOCAINE HCL (PF) 1 % IJ SOLN
30.0000 mL | INTRAMUSCULAR | Status: AC | PRN
  Administered 2021-06-07: 30 mL via SUBCUTANEOUS
  Filled 2021-06-07: qty 30

## 2021-06-07 MED ORDER — METHYLERGONOVINE MALEATE 0.2 MG PO TABS: 0.2000 mg | ORAL_TABLET | ORAL | Status: DC | PRN

## 2021-06-07 MED ORDER — FENTANYL-BUPIVACAINE-NACL 0.5-0.125-0.9 MG/250ML-% EP SOLN
12.0000 mL/h | EPIDURAL | Status: DC | PRN
  Administered 2021-06-07: 12 mL/h via EPIDURAL
  Filled 2021-06-07: qty 250

## 2021-06-07 MED ORDER — SODIUM CHLORIDE 0.9 % IV SOLN: 1.0000 g | INTRAVENOUS | Status: DC

## 2021-06-07 MED ORDER — LACTATED RINGERS IV SOLN
500.0000 mL | INTRAVENOUS | Status: DC | PRN
  Administered 2021-06-07: 1000 mL via INTRAVENOUS

## 2021-06-07 MED ORDER — PRENATAL MULTIVITAMIN CH
1.0000 | ORAL_TABLET | Freq: Every day | ORAL | Status: DC
  Administered 2021-06-07 – 2021-06-09 (×3): 1 via ORAL
  Filled 2021-06-07 (×3): qty 1

## 2021-06-07 MED ORDER — DIBUCAINE (PERIANAL) 1 % EX OINT: 1.0000 "application " | TOPICAL_OINTMENT | CUTANEOUS | Status: DC | PRN

## 2021-06-07 MED ORDER — TRANEXAMIC ACID-NACL 1000-0.7 MG/100ML-% IV SOLN
1000.0000 mg | INTRAVENOUS | Status: AC
  Administered 2021-06-07: 1000 mg via INTRAVENOUS
  Filled 2021-06-07: qty 100

## 2021-06-07 MED ORDER — OXYCODONE-ACETAMINOPHEN 5-325 MG PO TABS: 2.0000 | ORAL_TABLET | ORAL | Status: DC | PRN

## 2021-06-07 MED ORDER — SIMETHICONE 80 MG PO CHEW: 80.0000 mg | CHEWABLE_TABLET | ORAL | Status: DC | PRN

## 2021-06-07 MED ORDER — SENNOSIDES-DOCUSATE SODIUM 8.6-50 MG PO TABS
2.0000 | ORAL_TABLET | Freq: Every day | ORAL | Status: DC
  Administered 2021-06-08 – 2021-06-09 (×2): 2 via ORAL
  Filled 2021-06-07 (×2): qty 2

## 2021-06-07 MED ORDER — DIPHENHYDRAMINE HCL 50 MG/ML IJ SOLN: 12.5000 mg | INTRAMUSCULAR | Status: DC | PRN

## 2021-06-07 MED ORDER — EPHEDRINE 5 MG/ML INJ: 10.0000 mg | INTRAVENOUS | Status: DC | PRN

## 2021-06-07 MED ORDER — LACTATED RINGERS IV SOLN: 500.0000 mL | Freq: Once | INTRAVENOUS | Status: DC

## 2021-06-07 MED ORDER — LIDOCAINE HCL (PF) 1 % IJ SOLN
INTRAMUSCULAR | Status: DC | PRN
  Administered 2021-06-07 (×2): 5 mL via EPIDURAL

## 2021-06-07 MED ORDER — ONDANSETRON HCL 4 MG PO TABS: 4.0000 mg | ORAL_TABLET | ORAL | Status: DC | PRN

## 2021-06-07 MED ORDER — TETANUS-DIPHTH-ACELL PERTUSSIS 5-2.5-18.5 LF-MCG/0.5 IM SUSY: 0.5000 mL | PREFILLED_SYRINGE | Freq: Once | INTRAMUSCULAR | Status: DC

## 2021-06-07 MED ORDER — ACETAMINOPHEN 325 MG PO TABS
650.0000 mg | ORAL_TABLET | ORAL | Status: DC | PRN
  Administered 2021-06-07 – 2021-06-09 (×4): 650 mg via ORAL
  Filled 2021-06-07 (×5): qty 2

## 2021-06-07 MED ORDER — ONDANSETRON HCL 4 MG/2ML IJ SOLN: 4.0000 mg | Freq: Four times a day (QID) | INTRAMUSCULAR | Status: DC | PRN

## 2021-06-07 MED ORDER — DIPHENHYDRAMINE HCL 25 MG PO CAPS: 25.0000 mg | ORAL_CAPSULE | Freq: Four times a day (QID) | ORAL | Status: DC | PRN

## 2021-06-07 MED ORDER — ONDANSETRON HCL 4 MG/2ML IJ SOLN: 4.0000 mg | INTRAMUSCULAR | Status: DC | PRN

## 2021-06-07 MED ORDER — OXYTOCIN-SODIUM CHLORIDE 30-0.9 UT/500ML-% IV SOLN
2.5000 [IU]/h | INTRAVENOUS | Status: DC
  Administered 2021-06-07: 2.5 [IU]/h via INTRAVENOUS
  Filled 2021-06-07: qty 500

## 2021-06-07 NOTE — MAU Note (Signed)
..  Melanie Peterson is a 40 y.o. at [redacted]w[redacted]d here in MAU reporting: pt rushed directly into room from lobby, due to frequency of contractions. Pt reports contractions since midnight. SVE: 8cm 90% -1 by Alvino Chapel, RN FHR: 145 J. Walker, CNM called for orders.  Covid swab obtained LD called for bed assignment.

## 2021-06-07 NOTE — Anesthesia Postprocedure Evaluation (Signed)
Anesthesia Post Note  Patient: Melanie Peterson  Procedure(s) Performed: AN AD HOC LABOR EPIDURAL     Anesthesia Post Evaluation No notable events documented.  Last Vitals:  Vitals:   06/07/21 0816 06/07/21 0950  BP: 115/76 105/62  Pulse: 73 75  Resp: 16 18  Temp: 37 C 37.1 C    Last Pain:  Vitals:   06/07/21 0950  TempSrc: Oral  PainSc:    Pain Goal: Patients Stated Pain Goal: 6 (06/07/21 0303)                 Sonda Primes

## 2021-06-07 NOTE — H&P (Signed)
Melanie Peterson is a 40 y.o. [phone] female at [redacted]w[redacted]d presenting for spontaneous onset of labor at 12am, reports normal fetal movement, bloody show only and no loss of fluid. Presented to MAU at 8cm with a bulging bag.  Receives care at [facility], prenatal records reviewed. Has gestational thrombocytopenia and a history of postpartum hemorrhage, is very nervous about another hemorrhage. OB History     Gravida  6   Para  4   Term  4   Preterm  0   AB  1   Living  4      SAB  1   IAB  0   Ectopic  0   Multiple  0   Live Births  4          Past Medical History:  Diagnosis Date   H/O postpartum hemorrhage, currently pregnant    Past Surgical History:  Procedure Laterality Date   NO PAST SURGERIES     Family History: family history includes Diabetes in her mother; Heart disease in her maternal grandmother; Hypertension in her maternal grandmother and mother. Social History:  reports that she has never smoked. She has never used smokeless tobacco. She reports that she does not drink alcohol and does not use drugs.    Maternal Diabetes: No Genetic Screening: Normal Maternal Ultrasounds/Referrals: Normal Fetal Ultrasounds or other Referrals:  None Maternal Substance Abuse:  No Significant Maternal Medications:  None Significant Maternal Lab Results:  Group B Strep positive Other Comments:   gestational thrombocytopenia, last platelets 125 on 05/28/21  Review of Systems  Constitutional:  Negative for fever.  HENT:  Negative for congestion and sore throat.   Eyes:  Negative for photophobia and visual disturbance.  Respiratory:  Negative for cough and shortness of breath.   Gastrointestinal:  Positive for abdominal pain (contractions). Negative for constipation.  Genitourinary:  Negative for vaginal bleeding.  Allergic/Immunologic: Positive for food allergies (fish, peanut butter flavor, shrimp).  Neurological:  Negative for syncope and headaches.  Maternal Medical  History:  Reason for admission: Contractions.   Contractions: Onset was 3-5 hours ago.   Frequency: regular.   Duration is approximately 1 minute.   Perceived severity is strong.   Fetal activity: Perceived fetal activity is normal.   Last perceived fetal movement was within the past hour.   Prenatal complications: Thrombocytopenia.   Prenatal Complications - Diabetes: none.  Dilation: 8 Effacement (%): 90 Station: -1 Exam by:: Quintella Baton RNC Blood pressure 126/89, pulse 96, temperature 98.8 F (37.1 C), temperature source Oral, height 5\' 5"  (1.651 m), weight 180 lb (81.6 kg), last menstrual period 09/09/2020, unknown if currently breastfeeding. Maternal Exam:  Uterine Assessment: Contraction strength is firm.  Contraction duration is 2 minutes. Contraction frequency is regular.  Abdomen: Patient reports no abdominal tenderness. Fetal presentation: vertex Introitus: Normal vulva. Normal vagina.  Pelvis: adequate for delivery.   Cervix: Cervix evaluated by digital exam.     Fetal Exam Fetal Monitor Review: Mode: ultrasound.   Baseline rate: 130.  Variability: moderate (6-25 bpm).   Pattern: accelerations present and no decelerations.   Initially had minimal reactivity, improved with 09/11/2020 LR bolus  Fetal State Assessment: Category I - tracings are normal.  Physical Exam Vitals and nursing note reviewed.  Constitutional:      General: She is in acute distress (from labor pains).     Appearance: Normal appearance.  HENT:     Head: Normocephalic and atraumatic.  Eyes:     Extraocular Movements: Extraocular  movements intact.     Pupils: Pupils are equal, round, and reactive to light.  Cardiovascular:     Rate and Rhythm: Normal rate and regular rhythm.  Pulmonary:     Effort: Pulmonary effort is normal.  Abdominal:     Palpations: Abdomen is soft.  Genitourinary:    General: Normal vulva.  Musculoskeletal:        General: Normal range of motion.     Cervical back:  Normal range of motion.  Skin:    General: Skin is warm.     Capillary Refill: Capillary refill takes less than 2 seconds.  Neurological:     Mental Status: She is alert and oriented to person, place, and time.  Psychiatric:        Mood and Affect: Mood normal.        Behavior: Behavior normal.        Thought Content: Thought content normal.        Judgment: Judgment normal.    Prenatal labs: ABO, Rh: --/--/PENDING (11/24 [id number]) Antibody: PENDING (11/24 0305) Rubella: 15.50 (05/16 1022) RPR: Non Reactive (09/08 0832)  HBsAg: Negative (05/16 1022)  HIV: Non Reactive (09/08 0832)  GBS: Positive/-- (11/14 1529)   Assessment/Plan: [id number] and [redacted]w[redacted]d in active labor Admit to L&D for expectant management of labor Anticipate precipitous delivery TXA to run prior to delivery, Pitocin to start as soon as baby is born, [facility] cart outside room, MD aware Pt may have epidural on request pending CBC   Melanie Peterson 06/07/2021, 3:43 AM

## 2021-06-07 NOTE — Anesthesia Preprocedure Evaluation (Addendum)
Anesthesia Evaluation  Patient identified by MRN, date of birth, ID band Patient awake    Reviewed: Allergy & Precautions, Patient's Chart, lab work & pertinent test results  Airway Mallampati: II       Dental no notable dental hx.    Pulmonary neg pulmonary ROS,    Pulmonary exam normal        Cardiovascular negative cardio ROS Normal cardiovascular exam     Neuro/Psych negative neurological ROS     GI/Hepatic negative GI ROS, Neg liver ROS,   Endo/Other  negative endocrine ROS  Renal/GU negative Renal ROS  negative genitourinary   Musculoskeletal negative musculoskeletal ROS (+)   Abdominal   Peds  Hematology Gestational Thrombocytopenia   Anesthesia Other Findings   Reproductive/Obstetrics (+) Pregnancy                            Anesthesia Physical Anesthesia Plan  ASA: 2  Anesthesia Plan: Epidural   Post-op Pain Management:    Induction:   PONV Risk Score and Plan:   Airway Management Planned: Natural Airway  Additional Equipment:   Intra-op Plan:   Post-operative Plan:   Informed Consent: I have reviewed the patients History and Physical, chart, labs and discussed the procedure including the risks, benefits and alternatives for the proposed anesthesia with the patient or authorized representative who has indicated his/her understanding and acceptance.       Plan Discussed with: Anesthesiologist  Anesthesia Plan Comments:         Anesthesia Quick Evaluation

## 2021-06-07 NOTE — Anesthesia Procedure Notes (Signed)
Epidural Patient location during procedure: OB Start time: 06/07/2021 4:31 AM End time: 06/07/2021 4:40 AM  Staffing Anesthesiologist: Mal Amabile, MD Performed: anesthesiologist   Preanesthetic Checklist Completed: patient identified, IV checked, site marked, risks and benefits discussed, surgical consent, monitors and equipment checked, pre-op evaluation and timeout performed  Epidural Patient position: sitting Prep: DuraPrep and site prepped and draped Patient monitoring: continuous pulse ox and blood pressure Approach: midline Location: L3-L4 Injection technique: LOR air  Needle:  Needle type: Tuohy  Needle gauge: 17 G Needle length: 9 cm and 9 Needle insertion depth: 5 cm Catheter type: closed end flexible Catheter size: 19 Gauge Catheter at skin depth: 10 cm Test dose: negative and Other  Assessment Events: blood not aspirated, injection not painful, no injection resistance, no paresthesia and negative IV test  Additional Notes Patient identified. Risks and benefits discussed including failed block, incomplete  Pain control, post dural puncture headache, nerve damage, paralysis, blood pressure Changes, nausea, vomiting, reactions to medications-both toxic and allergic and post Partum back pain. All questions were answered. Patient expressed understanding and wished to proceed. Sterile technique was used throughout procedure. Epidural site was Dressed with sterile barrier dressing. No paresthesias, signs of intravascular injection Or signs of intrathecal spread were encountered.  Patient was more comfortable after the epidural was dosed. Please see RN's note for documentation of vital signs and FHR which are stable. Reason for block:procedure for pain

## 2021-06-07 NOTE — Discharge Summary (Signed)
Postpartum Discharge Summary     Patient Name: Melanie Peterson DOB: 1980/09/08 MRN: [id number]  Date of admission: 06/07/2021 Delivery date:06/07/2021  Delivering provider: Gaylan Gerold R  Date of discharge: 06/09/2021  Admitting diagnosis: Indication for care in labor or delivery [O75.9] Intrauterine pregnancy: [redacted]w[redacted]d    Secondary diagnosis:  Principal Problem:   Vaginal delivery Active Problems:   Sickle cell trait ([place]   H/O postpartum hemorrhage, currently pregnant   History of thrombocytopenia   AMA (advanced maternal age) multigravida 35+   GBS bacteriuria   Indication for care in labor or delivery  Additional problems: None    Discharge diagnosis: Term Pregnancy Delivered                                              Post partum procedures: None Augmentation: AROM Complications: None  Hospital course: Onset of Labor With Vaginal Delivery      40y.o. yo [id number] admitted in Active Labor on 06/07/2021. Patient had an uncomplicated labor course as follows:  Membrane Rupture Time/Date: 4:48 AM ,06/07/2021   Delivery Method:Vaginal, Spontaneous  Episiotomy: None  Lacerations:  1st degree;Perineal  Patient had an uncomplicated postpartum course.  She is ambulating, tolerating a regular diet, passing flatus, and urinating well. Patient is discharged home in stable condition on 06/09/21.  Newborn Data: Birth date:06/07/2021  Birth time:5:17 AM  Gender:Female  Living status:Living  Apgars:9 ,9  Weight:3900 g   Magnesium Sulfate received: No BMZ received: No Rhophylac: N/A MMR: N/A T-DaP: Given prenatally Flu: No Transfusion: No  Physical exam  Vitals:   06/07/21 1937 06/08/21 0601 06/08/21 2146 06/09/21 0700  BP: 109/68 117/77 116/71 111/68  Pulse: 63 61 71 78  Resp: '18 18 18 16  ' Temp: 98 F (36.7 C) 99 F (37.2 C) 98.2 F (36.8 C) 97.7 F (36.5 C)  TempSrc: Oral Oral Oral Oral  SpO2: 100% 100% 100% 99%  Weight:      Height:        General: alert, cooperative, and no distress Lochia: appropriate Uterine Fundus: firm and below umbilicus  DVT Evaluation: no LE edema or calf tenderness to palpation   Labs: Lab Results  Component Value Date   WBC 7.4 06/08/2021   HGB 12.9 06/08/2021   HCT 38.6 06/08/2021   MCV 84.8 06/08/2021   PLT 108 (L) 06/08/2021    After visit meds:  Allergies as of 06/09/2021       Reactions   Fish Allergy Itching   Peanut Butter Flavor Itching   Shrimp (diagnostic) Itching        Medication List     STOP taking these medications    aspirin EC 81 MG tablet       TAKE these medications    acetaminophen 500 MG tablet Commonly known as: TYLENOL Take 2 tablets (1,000 mg total) by mouth every 8 (eight) hours as needed (pain). What changed:  medication strength how much to take when to take this reasons to take this   Blood Pressure Kit Devi 1 Device by Does not apply route as needed.   clobetasol ointment 0.05 % Commonly known as: TEMOVATE Apply 1 application topically 2 (two) times daily. Apply to affected area   ferrous sulfate 325 (65 FE) MG tablet Take 1 tablet (325 mg total) by mouth every other day.  ibuprofen 600 MG tablet Commonly known as: ADVIL Take 1 tablet (600 mg total) by mouth every 6 (six) hours as needed (pain).   prenatal vitamin w/FE, FA 29-1 MG Chew chewable tablet Chew 1 tablet by mouth daily at 12 noon.         Discharge home in stable condition Infant Feeding: Breast Infant Disposition: home with mother Discharge instruction: per After Visit Summary and Postpartum booklet. Activity: Advance as tolerated. Pelvic rest for 6 weeks.  Diet: routine diet Future Appointments: Future Appointments  Date Time Provider [place]  07/17/2021 10:15 AM Starr Lake, CNM [facility]   Follow up Visit: Postpartum appt booked as listed above, no message sent Gaylan Gerold, CNM) Please schedule this patient for a In  person postpartum visit in 6 weeks with the following provider: APP. Additional Postpartum F/U: None   Low risk pregnancy complicated by:  Gestational thrombocytopenia. Platelets 108 prior to discharge.  Delivery mode:  Vaginal, Spontaneous  Anticipated Birth Control:  Nexplanon versus IUD outpatient   06/09/2021 Genia Del, MD

## 2021-06-08 DIAGNOSIS — Z298 Encounter for other specified prophylactic measures: Secondary | ICD-10-CM | POA: Diagnosis not present

## 2021-06-08 LAB — CBC
HCT: 38.6 % (ref 36.0–46.0)
Hemoglobin: 12.9 g/dL (ref 12.0–15.0)
MCH: 28.4 pg (ref 26.0–34.0)
MCHC: 33.4 g/dL (ref 30.0–36.0)
MCV: 84.8 fL (ref 80.0–100.0)
Platelets: 108 10*3/uL — ABNORMAL LOW (ref 150–400)
RBC: 4.55 MIL/uL (ref 3.87–5.11)
RDW: 14.8 % (ref 11.5–15.5)
WBC: 7.4 10*3/uL (ref 4.0–10.5)
nRBC: 0 % (ref 0.0–0.2)

## 2021-06-08 NOTE — Progress Notes (Signed)
OB Note R/B to circumcision d/w mom and she would like to proceed  Cornelia Copa MD Attending Center for [employer] (Faculty Practice) 06/08/2021 Time: [phone]

## 2021-06-08 NOTE — Progress Notes (Signed)
Patient ID: Melanie Peterson, female   DOB: 11/11/1980, 40 y.o.   MRN: [id number]  POSTPARTUM PROGRESS NOTE  Post Partum Day 1  Subjective:  Adreanne Yono is a 40 y.o. P6P9509 s/p SVD at [redacted]w[redacted]d.  No acute events overnight.  Pt denies problems with ambulating, voiding or po intake.  She denies nausea or vomiting.  Pain is moderately controlled.   Lochia Small.   Objective: Blood pressure 117/77, pulse 61, temperature 99 F (37.2 C), temperature source Oral, resp. rate 18, height 5\' 5"  (1.651 m), weight 81.6 kg, last menstrual period 09/09/2020, SpO2 100 %, unknown if currently breastfeeding.  Physical Exam:  General: alert, cooperative and no distress Chest: no respiratory distress Heart:regular rate, distal pulses intact Abdomen: soft, nontender,  Uterine Fundus: firm, appropriately tender DVT Evaluation: No calf swelling or tenderness Extremities: Negative edema Skin: warm, dry  Recent Labs    06/07/21 0800 06/08/21 0528  HGB 15.4* 12.9  HCT 46.9* 38.6    Assessment/Plan: Meital Riehl is a 40 y.o. 30 s/p SVD at [redacted]w[redacted]d   PPD#1 - Doing well Contraception: Unsure, would like nexplanon or IUD outpatient.  Feeding: Breast Dispo: Plan for discharge Later today or tomorrow. Per RN infant is being monitored for Bilirubin levels. Uncertain if baby will be discharged today.    LOS: 1-2 day   [redacted]w[redacted]d I, NP 06/08/2021, 7:09 AM

## 2021-06-09 MED ORDER — ACETAMINOPHEN 500 MG PO TABS
1000.0000 mg | ORAL_TABLET | Freq: Three times a day (TID) | ORAL | 0 refills | Status: DC | PRN
Start: 2021-06-09 — End: 2022-01-21

## 2021-06-09 MED ORDER — IBUPROFEN 600 MG PO TABS: 600.0000 mg | ORAL_TABLET | Freq: Four times a day (QID) | ORAL | 0 refills | Status: DC | PRN

## 2021-06-13 ENCOUNTER — Encounter: Payer: Medicaid Other | Admitting: Certified Nurse Midwife

## 2021-06-20 ENCOUNTER — Telehealth (HOSPITAL_COMMUNITY): Payer: Self-pay

## 2021-06-20 NOTE — Telephone Encounter (Signed)
No answer. Left message to return nurse call.  Marcelino Duster [facility] 06/20/2021,1646

## 2021-07-01 ENCOUNTER — Encounter: Payer: Self-pay | Admitting: Student

## 2021-07-17 ENCOUNTER — Ambulatory Visit (INDEPENDENT_AMBULATORY_CARE_PROVIDER_SITE_OTHER): Payer: Medicaid Other | Admitting: Student

## 2021-07-17 ENCOUNTER — Other Ambulatory Visit (HOSPITAL_COMMUNITY)
Admission: RE | Admit: 2021-07-17 | Discharge: 2021-07-17 | Disposition: A | Discharge status: Home / Self Care | Payer: Medicaid Other | Source: Ambulatory Visit | Attending: Student | Admitting: Student

## 2021-07-17 ENCOUNTER — Other Ambulatory Visit: Payer: Self-pay

## 2021-07-17 VITALS — BP 126/90 | HR 83 | Wt 162.9 lb

## 2021-07-17 DIAGNOSIS — Z975 Presence of (intrauterine) contraceptive device: Secondary | ICD-10-CM

## 2021-07-17 DIAGNOSIS — Z1151 Encounter for screening for human papillomavirus (HPV): Secondary | ICD-10-CM

## 2021-07-17 DIAGNOSIS — R300 Dysuria: Secondary | ICD-10-CM | POA: Diagnosis not present

## 2021-07-17 DIAGNOSIS — Z113 Encounter for screening for infections with a predominantly sexual mode of transmission: Secondary | ICD-10-CM

## 2021-07-17 DIAGNOSIS — Z30430 Encounter for insertion of intrauterine contraceptive device: Secondary | ICD-10-CM

## 2021-07-17 DIAGNOSIS — Z3202 Encounter for pregnancy test, result negative: Secondary | ICD-10-CM | POA: Diagnosis not present

## 2021-07-17 LAB — POCT PREGNANCY, URINE: Preg Test, Ur: NEGATIVE

## 2021-07-17 MED ORDER — LEVONORGESTREL 20.1 MCG/DAY IU IUD
1.0000 | INTRAUTERINE_SYSTEM | Freq: Once | INTRAUTERINE | Status: AC
  Administered 2021-07-17: 1 via INTRAUTERINE

## 2021-07-17 NOTE — Progress Notes (Signed)
Post Partum Visit Note  Melanie Peterson is a 41 y.o. [phone] female who presents for a postpartum visit. She is 5 weeks and 5 days postpartum following a normal spontaneous vaginal delivery.  I have fully reviewed the prenatal and intrapartum course. The delivery was at [redacted]w[redacted]d gestational weeks.  Anesthesia: epidural. Postpartum course has been going well per patient.Melanie Peterson is doing well. Baby is feeding by both breast and bottle - Similac Advance. Bleeding staining only. Bowel function is abnormal: patient states she is constipated . Bladder function is normal. Patient is not sexually active. Contraception method is none. Postpartum depression screening: negative.  Patient would like IUD today but does not want her husband to know.  Patient also reports some burning due to healing laceration The pregnancy intention screening data noted above was reviewed. Potential methods of contraception were discussed. The patient elected to proceed with No data recorded.   Edinburgh Postnatal Depression Scale - 07/17/21 1006       Edinburgh Postnatal Depression Scale:  In the Past 7 Days   I have been able to laugh and see the funny side of things. 0    I have looked forward with enjoyment to things. 0    I have blamed myself unnecessarily when things went wrong. 0    I have been anxious or worried for no good reason. 0    I have felt scared or panicky for no good reason. 0    Things have been getting on top of me. 0    I have been so unhappy that I have had difficulty sleeping. 0    I have felt sad or miserable. 0    I have been so unhappy that I have been crying. 0    The thought of harming myself has occurred to me. 0    Edinburgh Postnatal Depression Scale Total 0             Health Maintenance Due  Topic Date Due   COVID-19 Vaccine (1) Never done   Pneumococcal Vaccine 34-54 Years old (1 - PCV) Never done   PAP SMEAR-Modifier  09/18/2019    The following portions of the patient's  history were reviewed and updated as appropriate: allergies, current medications, past family history, past medical history, past social history, past surgical history, and problem list.  Review of Systems Pertinent items are noted in HPI.  Objective:  BP 126/90    Pulse 83    Wt 162 lb 14.4 oz (73.9 kg)    LMP 09/09/2020 (Exact Date)    Breastfeeding Yes    BMI 27.11 kg/m    General:  alert and cooperative   Breasts:  normal  Lungs: clear to auscultation bilaterally  Heart:  regular rate and rhythm, S1, S2 normal, no murmur, click, rub or gallop  Abdomen: soft, non-tender; bowel sounds normal; no masses,  no organomegaly   Wound NA  GU exam:   NEFG: laceration healing in progress; no blood in vagina       Assessment:   1. Screening for human papillomavirus (HPV)   2. Screening examination for sexually transmitted disease   3. Burning with urination   4. IUD contraception   5. Encounter for insertion of intrauterine contraceptive device      Healthy  postpartum exam.   Plan:   Essential components of care per ACOG recommendations:  1.  Mood and well being: Patient with negative depression screening today. Reviewed local resources for support.  -  Patient tobacco use? No.   - hx of drug use? No.    2. Infant care and feeding:  -Patient currently breastmilk feeding? Yes. Reviewed importance of draining breast regularly to support lactation.  -Social determinants of health (SDOH) reviewed in EPIC. No concerns. The following needs were identifiednone  3. Sexuality, contraception and birth spacing - Patient does not want a pregnancy in the next year.  Desired family size is 5 children.  - Reviewed forms of contraception in tiered fashion. Patient desired IUD today.   - Discussed birth spacing of 18 months  4. Sleep and fatigue -Encouraged family/partner/community support of 4 hrs of uninterrupted sleep to help with mood and fatigue  5. Physical Recovery  - Discussed  patients delivery and complications. She describes her labor as good. - Patient had a Vaginal, no problems at delivery. Patient had a 1st degree laceration. Perineal healing reviewed. Patient expressed understanding - Patient has urinary incontinence? No. - Patient is not safe to resume physical and sexual activity  6.  Health Maintenance - HM due items addressed  - Last pap smear No results found for: DIAGPAP Pap smear done at today's visit.  -Breast Cancer screening indicated? No.  -Urine culture sent  -GC CT cultures sent  7. Chronic Disease/Pregnancy Condition follow up: None  - PCP follow up  Marylene Land, CNM [facility], [facility]     GYNECOLOGY CLINIC PROCEDURE NOTE  Jose Alleyne is a 41 y.o. [phone] here for Liletta IUD insertion. No GYN concerns.  Pap smear done today. Patient also wants GC CT testing.   IUD Insertion Procedure Note Patient identified, informed consent performed, consent signed.   Discussed risks of irregular bleeding, cramping, infection, malpositioning or misplacement of the IUD outside the uterus which may require further procedure such as laparoscopy. Time out was performed.  Urine pregnancy test negative.  Speculum placed in the vagina.  Cervix visualized.  Cleaned with Betadine x 2.  Grasped anteriorly with a single tooth tenaculum.  Uterus sounded to 7 cm.  Liletta UD placed per manufacturer's recommendations.  Strings trimmed to 3 cm. Tenaculum was removed, good hemostasis noted.  Patient tolerated procedure well.   Patient was given post-procedure instructions.  She was advised to have backup contraception for one week.  Patient was also asked to check IUD strings periodically and follow up in 4 weeks for IUD check.

## 2021-07-18 LAB — CERVICOVAGINAL ANCILLARY ONLY
Bacterial Vaginitis (gardnerella): NEGATIVE
Candida Glabrata: NEGATIVE
Candida Vaginitis: NEGATIVE
Chlamydia: NEGATIVE
Comment: NEGATIVE
Comment: NEGATIVE
Comment: NEGATIVE
Comment: NEGATIVE
Comment: NEGATIVE
Comment: NORMAL
Neisseria Gonorrhea: NEGATIVE
Trichomonas: NEGATIVE

## 2021-07-19 ENCOUNTER — Encounter: Payer: Self-pay | Admitting: Student

## 2021-07-19 LAB — URINE CULTURE

## 2021-07-19 LAB — CYTOLOGY - PAP
Comment: NEGATIVE
Diagnosis: NEGATIVE
High risk HPV: NEGATIVE

## 2021-08-21 ENCOUNTER — Encounter: Payer: Self-pay | Admitting: Student

## 2021-08-23 ENCOUNTER — Ambulatory Visit: Payer: Self-pay | Admitting: Student

## 2021-09-12 ENCOUNTER — Encounter: Payer: Self-pay | Admitting: Student

## 2021-09-15 IMAGING — US US MFM OB DETAIL+14 WK
1 series · 13 of 28 positions shown · non-contrast
Comparison: none

[Series 1: us mfm ob detail+14 wk · 105 acquisitions, 13 frames shown]
[im 4/105]
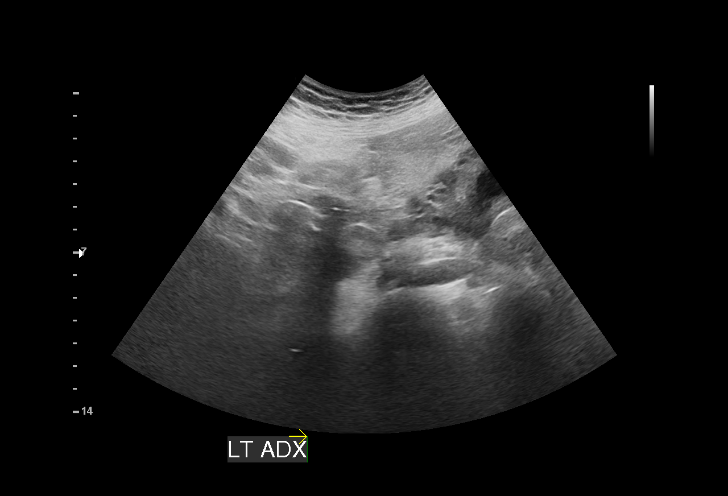
[im 12/105]
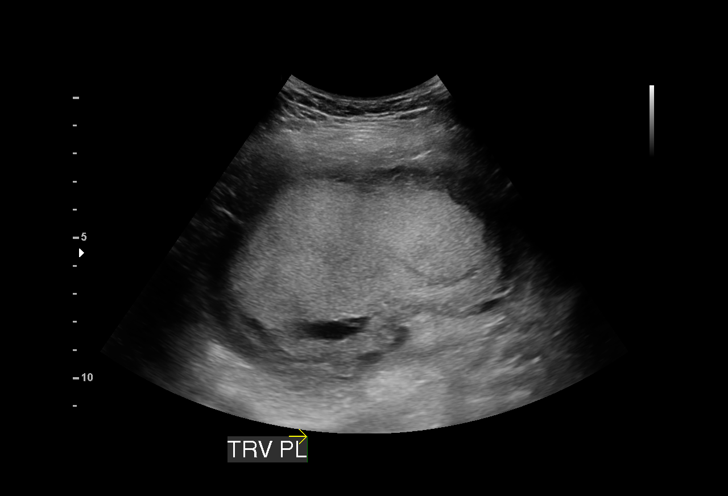
[im 20/105]
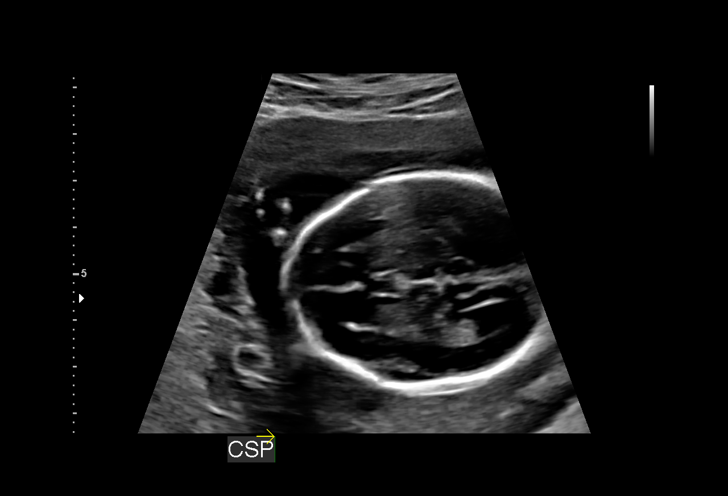
[im 27/105]
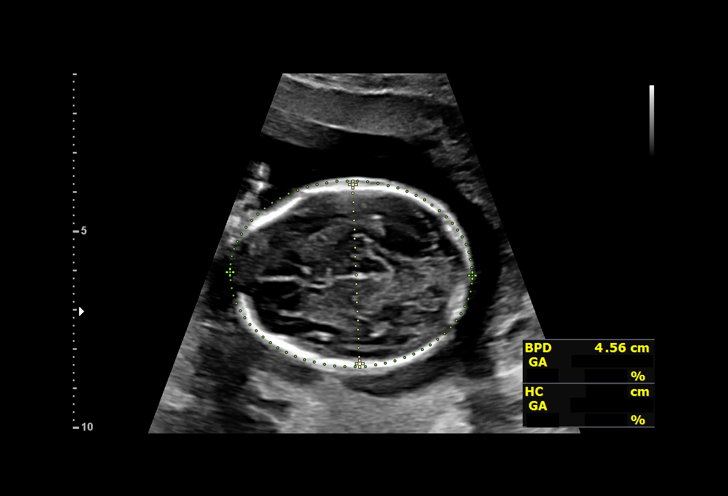
[im 35/105]
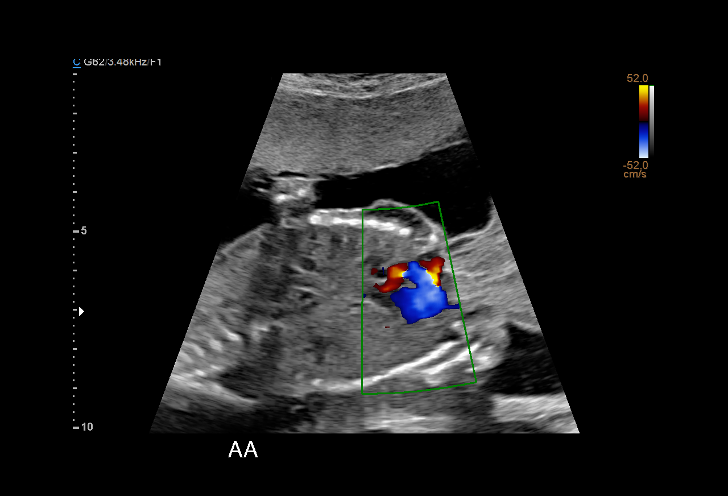
[im 43/105]
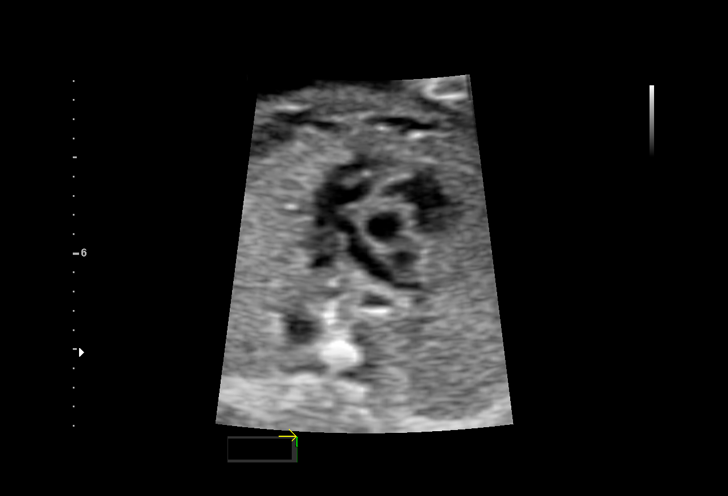
[im 54/105]
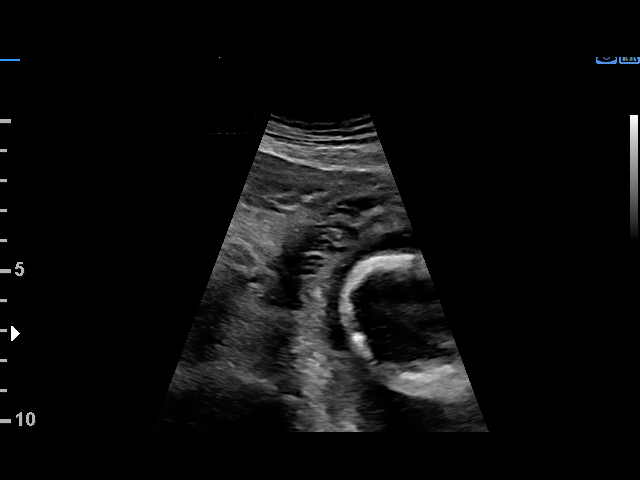
[im 62/105]
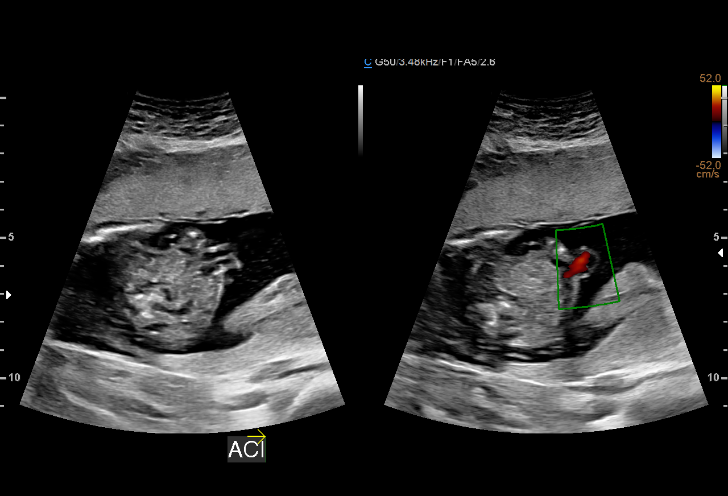
[im 70/105]
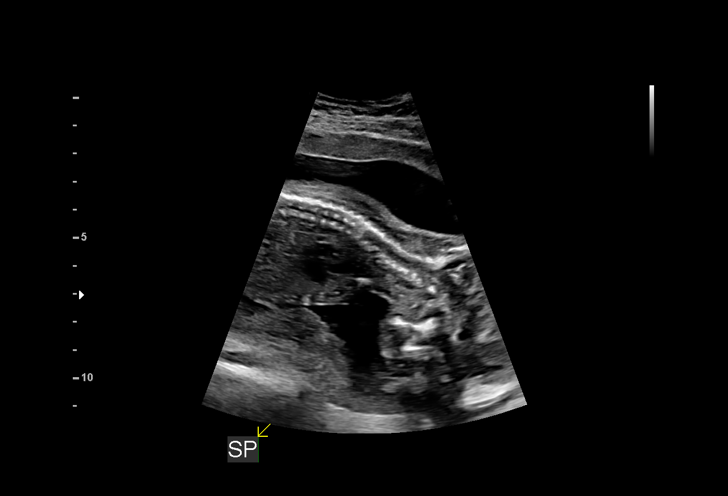
[im 78/105]
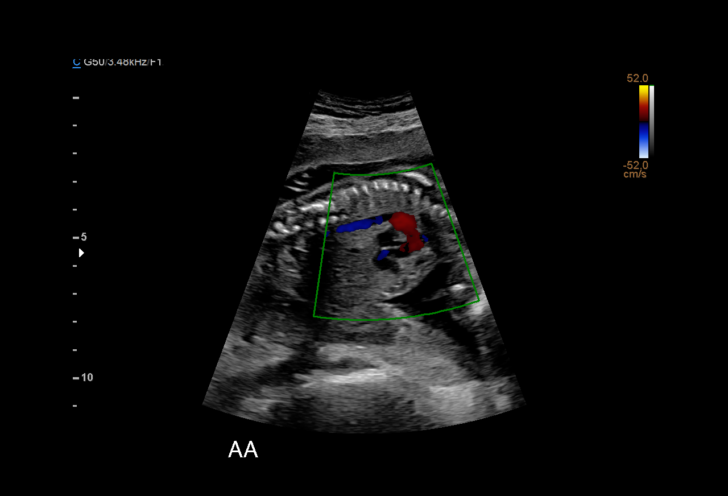
[im 85/105]
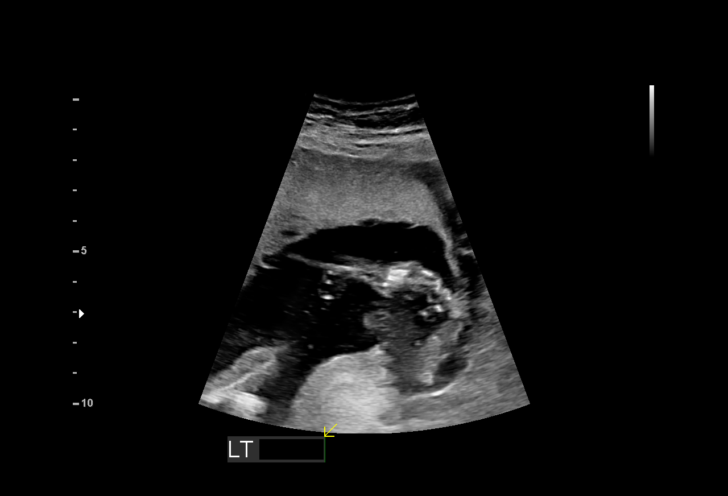
[im 93/105]
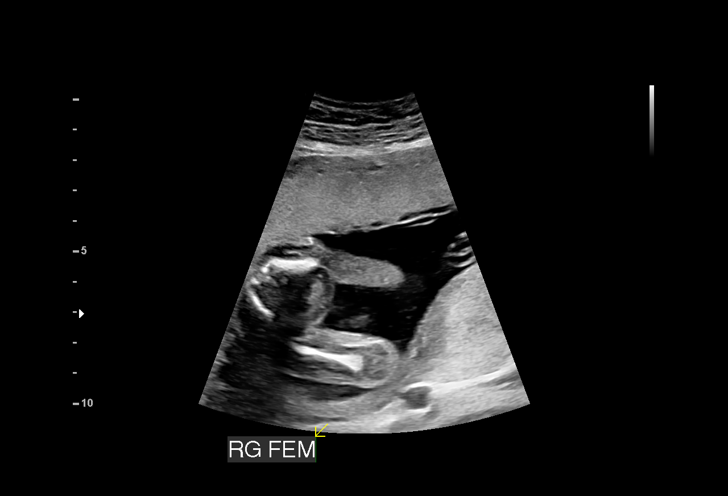
[im 101/105]
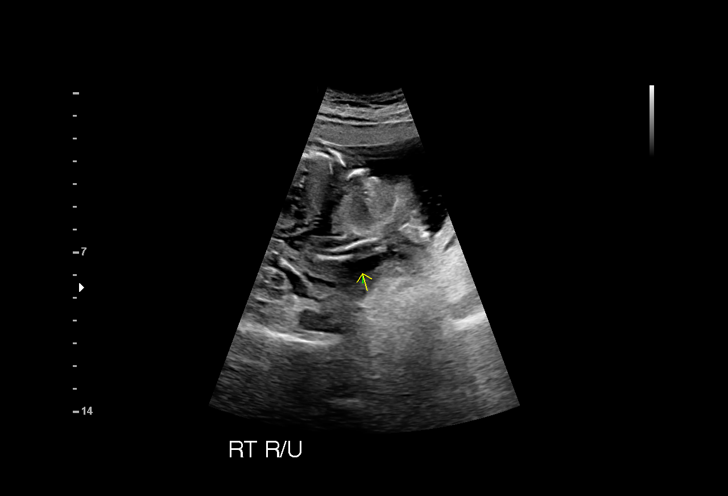

[13 of 28 positions shown; findings below may reference images not displayed]

Indications

 Advanced maternal age multigravida 35+,
 unspecified trimester
 History of sickle cell trait
 Encounter for antenatal screening for
 malformations
 19 weeks gestation of pregnancy
 History of macrosomic fetus
 Thrombocytopenia affecting pregnancy,          O99.119,
 antepartum
 History of postpartum hemmorage
 LR Male
Fetal Evaluation

 Num Of Fetuses:         1
 Preg. Location:         Intrauterine
 Fetal Heart Rate(bpm):  137
 Cardiac Activity:       Observed
 Presentation:           Cephalic
 Placenta:               Anterior
 P. Cord Insertion:      Visualized

 Amniotic Fluid
 AFI FV:      Within normal limits

                             Largest Pocket(cm)

Biometry

 BPD:      45.1  mm     G. Age:  19w 4d         66  %    CI:        70.53   %    70 - 86
                                                         FL/HC:      19.2   %    16.1 -
 HC:      171.2  mm     G. Age:  19w 5d         63  %    HC/AC:      1.08        1.09 -
 AC:      158.6  mm     G. Age:  21w 0d         91  %    FL/BPD:     72.7   %
 FL:       32.8  mm     G. Age:  20w 2d         77  %    FL/AC:      20.7   %    20 - 24
 HUM:      32.1  mm     G. Age:  20w 5d         90  %
 CER:        20  mm     G. Age:  19w 2d         50  %
 NFT:       5.3  mm
 LV:        7.1  mm
 CM:        5.4  mm

 Est. FW:     359  gm    0 lb 13 oz      97  %
OB History

 Blood Type:   O+
 Gravidity:    6         Term:   4         SAB:   1
Gestational Age

 LMP:           19w 2d        Date:  09/09/20                 EDD:   06/16/21
 U/S Today:     20w 1d                                        EDD:   06/10/21
 Best:          19w 2d     Det. By:  LMP  (09/09/20)          EDD:   06/16/21
Anatomy

 Cranium:               Appears normal         LVOT:                   Appears normal
 Cavum:                 Appears normal         Aortic Arch:            Appears normal
 Ventricles:            Appears normal         Ductal Arch:            Appears normal
 Choroid Plexus:        Appears normal         Diaphragm:              Appears normal
 Cerebellum:            Appears normal         Stomach:                Appears normal, left
                                                                       sided
 Posterior Fossa:       Appears normal         Abdomen:                Appears normal
 Nuchal Fold:           Appears normal         Abdominal Wall:         Appears nml (cord
                                                                       insert, abd wall)
 Face:                  Orbits appear          Cord Vessels:           Appears normal (3
                        normal                                         vessel cord)
 Lips:                  Appears normal         Kidneys:                Appear normal
 Palate:                Limited Views          Bladder:                Appears normal
 Thoracic:              Appears normal         Spine:                  Appears normal
 Heart:                 Appears normal         Upper Extremities:      Visualized
                        (4CH, axis, and
                        situs)
 RVOT:                  Appears normal         Lower Extremities:      Visualized

 Other:  Fetus appears to be male. VC, 3VV and 3VTV visualized.
Cervix Uterus Adnexa

 Cervix
 Length:            4.1  cm.
 Normal appearance by transabdominal scan.
 Uterus
 No abnormality visualized.

 Right Ovary
 Not visualized.

 Left Ovary
 Not visualized.

 Cul De Sac
 No free fluid seen.

 Adnexa
 No abnormality visualized.
Impression

 G6 P4.  Patient is here for fetal anatomy scan.

 On cell-free fetal DNA screening, the risks of fetal
 aneuploidies are not increased .MSAFP screening showed
 low risk for open-neural tube defects .  Obstetric history is
 significant for 4 term vaginal deliveries.  Patient has sickle
 cell trait and her husband's (father of her previous 2 children)
 carrier status is not known.

 We performed fetal anatomy scan. No makers of
 aneuploidies or fetal structural defects are seen. Fetal
 biometry is consistent with her previously-established dates.
 Amniotic fluid is normal and good fetal activity is seen.
 Patient understands the limitations of ultrasound in detecting
 fetal anomalies.
Recommendations

 -An appointment was made for her to return in 4 weeks for
 completion of fetal anatomy.
                 Nohut, Olur

## 2021-10-25 ENCOUNTER — Ambulatory Visit (INDEPENDENT_AMBULATORY_CARE_PROVIDER_SITE_OTHER): Payer: Medicaid Other | Admitting: Student

## 2021-10-25 VITALS — BP 121/76 | HR 59 | Wt 159.3 lb

## 2021-10-25 DIAGNOSIS — Z30431 Encounter for routine checking of intrauterine contraceptive device: Secondary | ICD-10-CM

## 2021-10-25 NOTE — Progress Notes (Signed)
?  History:  ?[name] is a 41 y.o. [id number] who presents to clinic today for IUD; she is reporting that she is having some spotting, no pain and the bleeding is tapering off. SHe does not want to have any more children but knows that her husband will not agree to a BTL.  She denies any other concerns or complaints.  ? ?The following portions of the patient's history were reviewed and updated as appropriate: allergies, current medications, family history, past medical history, social history, past surgical history and problem list. ? ?Review of Systems:  ?Review of Systems  ?Constitutional: Negative.   ?HENT: Negative.    ?Respiratory: Negative.    ?Cardiovascular: Negative.   ?Genitourinary: Negative.   ?Musculoskeletal: Negative.   ?Skin: Negative.   ?Neurological: Negative.   ?Psychiatric/Behavioral: Negative.    ? ?  ?Objective:  ?Physical Exam ?BP 121/76   Pulse (!) 59   Wt 159 lb 4.8 oz (72.3 kg)   LMP 10/10/2021 (Exact Date)   Breastfeeding No   BMI 26.51 kg/m?  ?Physical Exam ?Constitutional:   ?   Appearance: Normal appearance.  ?Pulmonary:  ?   Effort: Pulmonary effort is normal.  ?Genitourinary: ?   General: Normal vulva.  ?Skin: ?   General: Skin is warm.  ?Neurological:  ?   General: No focal deficit present.  ?   Mental Status: She is alert.  ?Psychiatric:     ?   Mood and Affect: Mood normal.  ? ?NEFG; no blood or discharge in vagina; IUD strings visualized  ? ? ?Labs and Imaging ?No results found for this or any previous visit (from the past 24 hour(s)). ? ?No results found. ? ?Health Maintenance Due  ?Topic Date Due  ? COVID-19 Vaccine (1) Never done  ? ? ? ?Assessment & Plan:  ? ?1. Encounter for routine checking of intrauterine contraceptive device (IUD)   ?-pap UTD; not due till 2026 ?-not due for mammo yet ?-declines any other testing ? ? ?Approximately 19 minutes of total time was spent with this patient on discussion of symptoms and exam.  ? ?Starr Lake,  CNM ?10/25/2021 ?9:23 AM ? ?

## 2022-01-09 ENCOUNTER — Ambulatory Visit: Payer: Medicaid Other | Admitting: Student

## 2022-01-21 ENCOUNTER — Encounter: Payer: Self-pay | Admitting: Internal Medicine

## 2022-01-21 ENCOUNTER — Other Ambulatory Visit: Payer: Self-pay

## 2022-01-21 ENCOUNTER — Ambulatory Visit: Payer: Medicaid Other | Admitting: Internal Medicine

## 2022-01-21 VITALS — BP 116/69 | HR 70 | Temp 98.3°F | Ht 64.0 in | Wt 161.0 lb

## 2022-01-21 DIAGNOSIS — Z862 Personal history of diseases of the blood and blood-forming organs and certain disorders involving the immune mechanism: Secondary | ICD-10-CM

## 2022-01-21 DIAGNOSIS — D696 Thrombocytopenia, unspecified: Secondary | ICD-10-CM

## 2022-01-21 DIAGNOSIS — J358 Other chronic diseases of tonsils and adenoids: Secondary | ICD-10-CM

## 2022-01-21 DIAGNOSIS — R0989 Other specified symptoms and signs involving the circulatory and respiratory systems: Secondary | ICD-10-CM

## 2022-01-21 DIAGNOSIS — L209 Atopic dermatitis, unspecified: Secondary | ICD-10-CM | POA: Diagnosis present

## 2022-01-21 DIAGNOSIS — L309 Dermatitis, unspecified: Secondary | ICD-10-CM

## 2022-01-21 MED ORDER — AQUAPHOR OINTMENT BODY EX AERO: INHALATION_SPRAY | CUTANEOUS | 2 refills | Status: DC

## 2022-01-21 MED ORDER — CLOBETASOL PROPIONATE 0.05 % EX OINT: TOPICAL_OINTMENT | CUTANEOUS | 2 refills | Status: DC

## 2022-01-21 NOTE — Progress Notes (Unsigned)
Subjective:  CC: establish care, tonsil stones, rash  HPI:  [name] is a 41 y.o. female presenting to establish care with a past medical history stated below and presents today for tonsil stones and rash on hands. Please see problem based assessment and plan for additional details.  Past Medical History: Sickle cell trait History of thrombocytopenia Vaginal delivery with 3 children  Current Outpatient Medications on File Prior to Visit  Medication Sig Dispense Refill   Blood Pressure Monitoring (BLOOD PRESSURE KIT) DEVI 1 Device by Does not apply route as needed. (Patient not taking: Reported on 07/17/2021) 1 each 0   No current facility-administered medications on file prior to visit.    Family History  Problem Relation Age of Onset   Diabetes Mother    Hypertension Mother    Hypertension Maternal Grandmother    Heart disease Maternal Grandmother    Surgical history: SVD for all 5 children  Social History: She is married and has 5 children.  She and her husband both work.  She works from home currently.  Review of Systems: ROS negative except for what is noted on the assessment and plan.  Objective:   Vitals:   01/21/22 1337  BP: 116/69  Pulse: 70  Temp: 98.3 F (36.8 C)  TempSrc: Oral  SpO2: 100%  Weight: 161 lb (73 kg)  Height: 5' 4" (1.626 m)    Physical Exam: Constitutional: well-appearing, in no acute distress HENT: Tonsils are within normal limits, nonerythematous with no visible stones Cardiovascular: regular rate and rhythm, no m/r/g Pulmonary/Chest: normal work of breathing on room air, lungs clear to auscultation bilaterally Abdominal: soft, non-tender, non-distended MSK: normal bulk and tone Skin: warm and dry   Assessment & Plan:  Atopic dermatitis [name] presents with rash on her hands bilaterally.  She states that this rash has been present off and on since 2013.  Her hands feel dry and itchy when this happens.  She has  noticed that her rash happens more frequently after she eats peanut butter, shrimp, or tilapia.  She tries to avoid those foods now.  She did have an episode a few months ago where she ate tilapia and woke up overnight and felt like her throat was swollen making it more difficult to breathe with.  She was able to manage secretions at that time. She was using Goldbond lotion and recently switched to a lotion from [employer] that is scented.  She has not changed soaps or detergents recently.  She was given steroid cream by her OB a few months ago and has not noticed an improvement with the rash.  On physical exam her skin is dry and there is cracking along the PIP and DIP joints.  No erythema or excoriations present. A/P: Differentials include atopic dermatitis versus contact dermatitis.  Her story is more consistent with atopic dermatitis with possible allergy triggers with peanut butter, shrimp, and tilapia.  We discussed using emollient lotions, including Aquaphor, Vanicream, or Lubriderm.  We also talked about trying to put the steroid cream on knuckles where her skin is most affected and wearing the emollient lotion over this. -Emollient lotion -Clobetasol -Referral to allergy  History of thrombocytopenia [name] presents with history of thrombocytopenia.  Her platelets became low after becoming pregnant and were last checked in November and were at 108 at that time.  She has a history of sickle cell trait. A/P: Differential includes gestational thrombocytopenia.  If so platelets should have normalized since  she is 7 months out from delivery. - Check CBC/ iron  Tonsillolith [name] presents with a history of tonsil stones.  Please she has white material that come out of her tonsil a couple of times a month.  Her throat is usually sore and then she is able to find the stone and gets it out.  She has asked her dentist about this and they have stated that surgery is not typically done.   She states that her throat does not currently hurt and she does not think she has a stone at this time.  She is most bothered by halitosis.  On physical exam her tonsils are of normal size, not erythematous, and no stones are visible. A/P: Patient presenting with tonsillolith.  We talked about brushing teeth after every meal and gargling with warm water or mouthwash at night to try to get food particles out of mouth to prevent tonsil stones.  I talked with her about how tonsillectomy is the last line of treatment for this and trying to prevent them is the best treatment.  Globus sensation Patient states that she had episode several months ago waking up in the middle of the night and feeling like her throat was swollen.  This resolved on its own.  She was able to manage secretions at that time.  She is concerned that this is secondary to the tilapia that she ate for dinner.  She has noticed worsening of rash on her hands when she washes or eats shrimp.  She has also noticed worse symptoms with peanut butter. A/P: Referral to allergy   Patient discussed with Dr. Williams   Katie Masters, D.O. [facility]  PGY-2 Pager: [phone]  Phone: [phone] Date 01/22/2022  Time 5:44 AM      

## 2022-01-21 NOTE — Patient Instructions (Addendum)
Thank you, Ms.[facility] for allowing [place] to provide your care today. Today we discussed:  Itchy hands: this is likely from eczema. I am referring you to an allergist to get a better idea of what might be causing you to have this. Please use aquaphor, lubriderm, or vani-cream lotions. These will help your hands feel better. You can also try using the steroid cream on your knuckles and then layering over this lotion. Avoid lotions that have flagrance.   Tonsil stones: try to brush teeth after every meal. Please also gargle warm salt water or mouth wash at night.  Low platelet: I am rechecking some blood work today. I will call you with results.    I have ordered the following labs for you:  Lab Orders         CBC no Diff         Iron and IBC ([id number])       Referrals ordered today:   Referral Orders         Ambulatory referral to Allergy       I have ordered the following medication/changed the following medications:   Stop the following medications: Medications Discontinued During This Encounter  Medication Reason   prenatal vitamin w/FE, FA (NATACHEW) 29-1 MG CHEW chewable tablet Completed Course   acetaminophen (TYLENOL) 500 MG tablet Completed Course   ferrous sulfate 325 (65 FE) MG tablet Completed Course   ibuprofen (ADVIL) 600 MG tablet Completed Course   clobetasol ointment (TEMOVATE) 0.05 % Reorder     Start the following medications: Meds ordered this encounter  Medications   Emollient (AQUAPHOR OINTMENT BODY) AERO    Sig: Use 2 times daily over hands.    Dispense:  105 g    Refill:  2   clobetasol ointment (TEMOVATE) 0.05 %    Sig: Apply to knuckle joints and layer under emollient lotion.    Dispense:  30 g    Refill:  2     Follow up: 4 weeks    We look forward to seeing you next time. Please call our clinic at [phone] if you have any questions or concerns. The best time to call is Monday-Friday from 9am-4pm, but there is someone available  24/7. If after hours or the weekend, call the main hospital number and ask for the Internal Medicine Resident On-Call. If you need medication refills, please notify your pharmacy one week in advance and they will send [place] a request.   Thank you for trusting me with your care. Wishing you the best!   Rudene Christians, DO [facility]

## 2022-01-22 DIAGNOSIS — J358 Other chronic diseases of tonsils and adenoids: Secondary | ICD-10-CM | POA: Insufficient documentation

## 2022-01-22 DIAGNOSIS — L209 Atopic dermatitis, unspecified: Secondary | ICD-10-CM | POA: Insufficient documentation

## 2022-01-22 DIAGNOSIS — R0989 Other specified symptoms and signs involving the circulatory and respiratory systems: Secondary | ICD-10-CM | POA: Insufficient documentation

## 2022-01-22 LAB — CBC
Hematocrit: 40.1 % (ref 34.0–46.6)
Hemoglobin: 13.4 g/dL (ref 11.1–15.9)
MCH: 28.6 pg (ref 26.6–33.0)
MCHC: 33.4 g/dL (ref 31.5–35.7)
MCV: 86 fL (ref 79–97)
Platelets: 212 10*3/uL (ref 150–450)
RBC: 4.68 x10E6/uL (ref 3.77–5.28)
RDW: 13.1 % (ref 11.7–15.4)
WBC: 4.8 10*3/uL (ref 3.4–10.8)

## 2022-01-22 LAB — IRON AND TIBC
Iron Saturation: 16 % (ref 15–55)
Iron: 52 ug/dL (ref 27–159)
Total Iron Binding Capacity: 319 ug/dL (ref 250–450)
UIBC: 267 ug/dL (ref 131–425)

## 2022-01-22 NOTE — Assessment & Plan Note (Signed)
Ms. Muston presents with history of thrombocytopenia.  Her platelets became low after becoming pregnant and were last checked in November and were at 108 at that time.  She has a history of sickle cell trait. A/P: Differential includes gestational thrombocytopenia.  If so platelets should have normalized since she is 7 months out from delivery. - Check CBC/ iron  ADDENDUM 7/12: CBC and iron panel within normal limits. Platelets >200.

## 2022-01-22 NOTE — Assessment & Plan Note (Signed)
Melanie Peterson presents with a history of tonsil stones.  Please she has white material that come out of her tonsil a couple of times a month.  Her throat is usually sore and then she is able to find the stone and gets it out.  She has asked her dentist about this and they have stated that surgery is not typically done.  She states that her throat does not currently hurt and she does not think she has a stone at this time.  She is most bothered by halitosis.  On physical exam her tonsils are of normal size, not erythematous, and no stones are visible. A/P: Patient presenting with tonsillolith.  We talked about brushing teeth after every meal and gargling with warm water or mouthwash at night to try to get food particles out of mouth to prevent tonsil stones.  I talked with her about how tonsillectomy is the last line of treatment for this and trying to prevent them is the best treatment.

## 2022-01-22 NOTE — Assessment & Plan Note (Signed)
Melanie Peterson presents with rash on her hands bilaterally.  She states that this rash has been present off and on since 2013.  Her hands feel dry and itchy when this happens.  She has noticed that her rash happens more frequently after she eats peanut butter, shrimp, or tilapia.  She tries to avoid those foods now.  She did have an episode a few months ago where she ate tilapia and woke up overnight and felt like her throat was swollen making it more difficult to breathe with.  She was able to manage secretions at that time. She was using Goldbond lotion and recently switched to a lotion from [employer] that is scented.  She has not changed soaps or detergents recently.  She was given steroid cream by her OB a few months ago and has not noticed an improvement with the rash.  On physical exam her skin is dry and there is cracking along the PIP and DIP joints.  No erythema or excoriations present. A/P: Differentials include atopic dermatitis versus contact dermatitis.  Her story is more consistent with atopic dermatitis with possible allergy triggers with peanut butter, shrimp, and tilapia.  We discussed using emollient lotions, including Aquaphor, Vanicream, or Lubriderm.  We also talked about trying to put the steroid cream on knuckles where her skin is most affected and wearing the emollient lotion over this. -Emollient lotion -Clobetasol -Referral to allergy

## 2022-01-22 NOTE — Assessment & Plan Note (Signed)
Patient states that she had episode several months ago waking up in the middle of the night and feeling like her throat was swollen.  This resolved on its own.  She was able to manage secretions at that time.  She is concerned that this is secondary to the tilapia that she ate for dinner.  She has noticed worsening of rash on her hands when she washes or eats shrimp.  She has also noticed worse symptoms with peanut butter. A/P: Referral to allergy

## 2022-02-02 NOTE — Progress Notes (Signed)
Internal Medicine Clinic Attending  Case discussed with Dr. Masters  At the time of the visit.  We reviewed the resident's history and exam and pertinent patient test results.  I agree with the assessment, diagnosis, and plan of care documented in the resident's note.  

## 2022-02-18 ENCOUNTER — Ambulatory Visit (INDEPENDENT_AMBULATORY_CARE_PROVIDER_SITE_OTHER): Payer: Medicaid Other | Admitting: Student

## 2022-02-18 ENCOUNTER — Encounter: Payer: Self-pay | Admitting: Student

## 2022-02-18 ENCOUNTER — Other Ambulatory Visit: Payer: Self-pay

## 2022-02-18 DIAGNOSIS — J358 Other chronic diseases of tonsils and adenoids: Secondary | ICD-10-CM | POA: Diagnosis not present

## 2022-02-18 DIAGNOSIS — L2089 Other atopic dermatitis: Secondary | ICD-10-CM | POA: Diagnosis not present

## 2022-02-18 NOTE — Patient Instructions (Addendum)
Tonsillolith Patient inquired about tonsillectomy in the setting of recurrent tonsilliths. Since the last visit, patient has seen a couple of stones over the past two weeks. She is worried about the bad smell coming off of them though she does not think she herself has bad breath. Patient denies reflux symptoms.  She is using mouth daily. Patient with cryptic tonsils; no lith present on exam. No erythema or sputum in posterior oropharynx. Discussed and gave patient material about conservative management including dental care and different washes that could help keep food particles from the tonsillar crypts. Offered ENT follow up if she feels that this is not resolved with her dedicated care, if it causes her pain, or if they become large. Patient is in agreement as she will be traveling for the next couple of months and will reconsider once she is back. - Continue to monitor - Return to clinic if this issue does not resolve or patient would like a referral to ENT  Thank you, [facility] for allowing [place] to provide your care today. Today we discussed your previous skin rash and your tonsil stones.  Skin rash: atopic dermatitis   - Continue emollient cream: Aquaphor and the other gentle creams you have tried with good results - Continue to avoid triggers as we discussed today - Continue to monitor for flares --> use the clobetasol cream only when you feel like the original symptoms come back  Tonsil stones:  We spoke about dental care and different washes that could help keep food particles from the back of your mouth. If this does not resolve with your oral care, causes her pain, or if they become large, please return to clinic and we would send you to the Ear, Nose, and Throat specialists for follow up.   Please make sure to arrive 15 minutes prior to your next appointment. If you arrive late, you may be asked to reschedule.    We look forward to seeing you next time. Please call our clinic at  [phone] if you have any questions or concerns. The best time to call is Monday-Friday from 9am-4pm, but there is someone available 24/7. If after hours or the weekend, call the main hospital number and ask for the Internal Medicine Resident On-Call. If you need medication refills, please notify your pharmacy one week in advance and they will send [place] a request.   Thank you for letting us take part in your care. Wishing you the best!  Morene Crocker, MD 02/18/2022, 5:28 PM [facility] Internal Medicine Resident, PGY-1

## 2022-02-18 NOTE — Assessment & Plan Note (Signed)
Patient inquired about tonsillectomy in the setting of recurrent tonsilliths. Since the last visit, patient has seen a couple of stones over the past two weeks. She is worried about the bad smell coming off of them though she does not think she herself has bad breath. Patient denies reflux symptoms.  She is using mouth daily. Patient with cryptic tonsils; no lith present on exam. No erythema or sputum in posterior oropharynx. Discussed and gave patient material about conservative management including dental care and different washes that could help keep food particles from the tonsillar crypts. Offered ENT follow up if she feels that this is not resolved with her dedicated care, if it causes her pain, or if they become large. Patient is in agreement as she will be traveling for the next couple of months and will reconsider once she is back. - Continue to monitor - Return to clinic if this issue does not resolve or patient would like a referral to ENT

## 2022-02-18 NOTE — Assessment & Plan Note (Addendum)
Patient presenting in clinic for follow up for rash on dorsal hands bilaterally. Patient recently seen in clinic for this concern, endorsing dryness and pruritus. This was thought to be atopic dermatitis in the setting of allergens, as patient has recurrent rashes when exposed to penut butter, shrimp or tilapia. This treated with a course of topical clobetasol and instructed to apply emollient lotions. Patient returns with improvement of pruritus, without eruptions, or excoriations. No bleeding or oozing.  Skin appears moisturized; thought there is some thickening over the skin on the PIP and DIP joints.   Overall patient's previous dermatitis flare has resolved with the clobetasol cream. Patient has continued to avoid triggers and using emollient creams like Aquaphor. Patient instructed to only use clobetasol cream for flares as this is a high potency topical steroid. - Continue emollient cream - Continue to avoid triggers - Continue to monitor for flares

## 2022-02-18 NOTE — Progress Notes (Signed)
Subjective:  CC: skin rash  HPI:  [name] is a 41 y.o. female with a past medical history stated below and presents today for follow up of skin rash. Please see problem based assessment and plan for additional details.  Past Medical History:  Diagnosis Date   AMA (advanced maternal age) multigravida 35+    GBS bacteriuria 02/19/2021   Needs treatment in labor   H/O postpartum hemorrhage, currently pregnant    History of macrosomia in infant in prior pregnancy, currently pregnant 02/10/2017   Ten pounds per patient.  Pushed 3 times.  No shoulder dystocia.  Nml fundal height at 36 weeks.   Indication for care in labor or delivery 06/07/2021   Nausea and vomiting after administration of anesthetic agent 02/19/2021   Patient reports severe nausea and vomiting after epidural in first pregnancy in 2009. Has not had an epidural in past 2 pregnancies but desires epidural again for this pregnancy. May require prophylactic nausea medication.    Vaginal delivery 06/09/2021    Current Outpatient Medications on File Prior to Visit  Medication Sig Dispense Refill   Blood Pressure Monitoring (BLOOD PRESSURE KIT) DEVI 1 Device by Does not apply route as needed. (Patient not taking: Reported on 07/17/2021) 1 each 0   clobetasol ointment (TEMOVATE) 0.05 % Apply to knuckle joints and layer under emollient lotion. 30 g 2   Emollient (AQUAPHOR OINTMENT BODY) AERO Use 2 times daily over hands. 105 g 2   No current facility-administered medications on file prior to visit.    Family History  Problem Relation Age of Onset   Diabetes Mother    Hypertension Mother    Hypertension Maternal Grandmother    Heart disease Maternal Grandmother     Social History   Socioeconomic History   Marital status: Married    Spouse name: Not on file   Number of children: Not on file   Years of education: Not on file   Highest education level: Not on file  Occupational History   Not on file  Tobacco Use    Smoking status: Never   Smokeless tobacco: Never  Vaping Use   Vaping Use: Never used  Substance and Sexual Activity   Alcohol use: No   Drug use: No   Sexual activity: Yes    Birth control/protection: None  Other Topics Concern   Not on file  Social History Narrative   Not on file   Social Determinants of Health   Financial Resource Strain: Not on file  Food Insecurity: No Food Insecurity (05/16/2021)   Hunger Vital Sign    Worried About Running Out of Food in the Last Year: Never true    Ran Out of Food in the Last Year: Never true  Transportation Needs: No Transportation Needs (05/16/2021)   PRAPARE - Hydrologist (Medical): No    Lack of Transportation (Non-Medical): No  Physical Activity: Not on file  Stress: Not on file  Social Connections: Not on file  Intimate Partner Violence: Not on file    Review of Systems: ROS negative except for what is noted on the assessment and plan.  Objective:   Vitals:   02/18/22 1551  BP: 104/60  Pulse: 66  Temp: 98.7 F (37.1 C)  TempSrc: Oral  SpO2: 100%  Weight: 162 lb 9.6 oz (73.8 kg)  Height: '5\' 4"'  (1.626 m)    Physical Exam: Constitutional: well-appearing woman sitting in chair, in no acute distress HENT:  normocephalic atraumatic, mucous membranes moist Cardiovascular: regular rate and rhythm, no m/r/g Pulmonary/Chest: normal work of breathing on room air, lungs clear to auscultation bilaterally Neurological: alert & oriented x 3, Skin: warm and dry Psych: appropriate mood and affect     Assessment & Plan:   Atopic dermatitis Patient presenting in clinic for follow up for rash on dorsal hands bilaterally. Patient recently seen in clinic for this concern, endorsing dryness and pruritus. This was thought to be atopic dermatitis in the setting of allergens, as patient has recurrent rashes when exposed to penut butter, shrimp or tilapia. This treated with a course of topical clobetasol  and instructed to apply emollient lotions. Patient returns with improvement of pruritus, without eruptions, or excoriations. No bleeding or oozing.  Skin appears moisturized; thought there is some thickening over the skin on the PIP and DIP joints.   Overall patient's previous dermatitis flare has resolved with the clobetasol cream. Patient has continued to avoid triggers and using emollient creams like Aquaphor. Patient instructed to only use clobetasol cream for flares as this is a high potency topical steroid. - Continue emollient cream - Continue to avoid triggers - Continue to monitor for flares  Tonsillolith Patient inquired about tonsillectomy in the setting of recurrent tonsilliths. Since the last visit, patient has seen a couple of stones over the past two weeks. She is worried about the bad smell coming off of them though she does not think she herself has bad breath. Patient denies reflux symptoms.  She is using mouth daily. Patient with cryptic tonsils; no lith present on exam. No erythema or sputum in posterior oropharynx. Discussed and gave patient material about conservative management including dental care and different washes that could help keep food particles from the tonsillar crypts. Offered ENT follow up if she feels that this is not resolved with her dedicated care, if it causes her pain, or if they become large. Patient is in agreement as she will be traveling for the next couple of months and will reconsider once she is back. - Continue to monitor - Return to clinic if this issue does not resolve or patient would like a referral to ENT   Patient seen with Dr. Evette Doffing

## 2022-02-19 NOTE — Progress Notes (Signed)
Internal Medicine Clinic Attending  I saw and evaluated the patient.  I personally confirmed the key portions of the history and exam documented by Dr. Gomez-Caraballo and I reviewed pertinent patient test results.  The assessment, diagnosis, and plan were formulated together and I agree with the documentation in the resident's note.  

## 2022-05-14 NOTE — Progress Notes (Unsigned)
New Patient Note  RE: Melanie Peterson MRN: [id number] DOB: [birth year] Date of Office Visit: 05/15/2022  Consult requested by: Melanie Pou, MD Primary care provider: Christiana Fuchs, DO  Chief Complaint: No chief complaint on file.  History of Present Illness: I had the pleasure of seeing Melanie Peterson for initial evaluation at the [facility] [place] on 05/14/2022. She is a 41 y.o. female, who is referred here by Melanie Fuchs, DO for the evaluation of eczema and globus sensation.  Rash started about *** ago. Mainly occurs on her ***. Describes them as ***. Individual rashes lasts about ***. No ecchymosis upon resolution. Associated symptoms include: ***.  Frequency of episodes: ***. Suspected triggers are ***. Denies any *** fevers, chills, changes in medications, foods, personal care products or recent infections. She has tried the following therapies: *** with *** benefit. Systemic steroids ***. Currently on ***.  Previous work up includes: ***. Previous history of rash/hives: ***. Patient is up to date with the following cancer screening tests: ***.  01/21/2022 PCP visit: "Atopic dermatitis Ms. Peterson presents with rash on her hands bilaterally.  She states that this rash has been present off and on since 2013.  Her hands feel dry and itchy when this happens.  She has noticed that her rash happens more frequently after she eats peanut butter, shrimp, or tilapia.  She tries to avoid those foods now.  She did have an episode a few months ago where she ate tilapia and woke up overnight and felt like her throat was swollen making it more difficult to breathe with.  She was able to manage secretions at that time. She was using [employer] lotion and recently switched to a lotion from [employer] that is scented.  She has not changed soaps or detergents recently.  She was given steroid cream by her OB a few months ago and has not noticed an improvement with the rash.  On  physical exam her skin is dry and there is cracking along the PIP and DIP joints.  No erythema or excoriations present. A/P: Differentials include atopic dermatitis versus contact dermatitis.  Her story is more consistent with atopic dermatitis with possible allergy triggers with peanut butter, shrimp, and tilapia.  We discussed using emollient lotions, including Aquaphor, Vanicream, or Lubriderm.  We also talked about trying to put the steroid cream on knuckles where her skin is most affected and wearing the emollient lotion over this. -Emollient lotion -Clobetasol -Referral to allergy  PB, shrimp, tilapia lead to worsening of atopic dermatitis and globus sensation"  Assessment and Plan: Melanie is a 41 y.o. female with: No problem-specific Assessment & Plan notes found for this encounter.  No follow-ups on file.  No orders of the defined types were placed in this encounter.  Lab Orders  No laboratory test(s) ordered today    Other allergy screening: Asthma: {Blank single:19197::"yes","no"} Rhino conjunctivitis: {Blank single:19197::"yes","no"} Food allergy: {Blank single:19197::"yes","no"} Medication allergy: {Blank single:19197::"yes","no"} Hymenoptera allergy: {Blank single:19197::"yes","no"} Urticaria: {Blank single:19197::"yes","no"} Eczema:{Blank single:19197::"yes","no"} History of recurrent infections suggestive of immunodeficency: {Blank single:19197::"yes","no"}  Diagnostics: Spirometry:  Tracings reviewed. Her effort: {Blank single:19197::"Good reproducible efforts.","It was hard to get consistent efforts and there is a question as to whether this reflects a maximal maneuver.","Poor effort, data can not be interpreted."} FVC: ***L FEV1: ***L, ***% predicted FEV1/FVC ratio: ***% Interpretation: {Blank single:19197::"Spirometry consistent with mild obstructive disease","Spirometry consistent with moderate obstructive disease","Spirometry consistent with severe  obstructive disease","Spirometry consistent with possible restrictive disease","Spirometry consistent with mixed  obstructive and restrictive disease","Spirometry uninterpretable due to technique","Spirometry consistent with normal pattern","No overt abnormalities noted given today's efforts"}.  Please see scanned spirometry results for details.  Skin Testing: {Blank single:19197::"Select foods","Environmental allergy panel","Environmental allergy panel and select foods","Food allergy panel","None","Deferred due to recent antihistamines use"}. *** Results discussed with patient/family.   Past Medical History: Patient Active Problem List   Diagnosis Date Noted   Atopic dermatitis 01/22/2022   Tonsillolith 01/22/2022   Globus sensation 01/22/2022   History of thrombocytopenia    Sickle cell trait ([place]) 02/10/2017   History of anesthesia complications 71/69/6789   Past Medical History:  Diagnosis Date   AMA (advanced maternal age) multigravida 35+    GBS bacteriuria 02/19/2021   Needs treatment in labor   H/O postpartum hemorrhage, currently pregnant    History of macrosomia in infant in prior pregnancy, currently pregnant 02/10/2017   Ten pounds per patient.  Pushed 3 times.  No shoulder dystocia.  Nml fundal height at 36 weeks.   Indication for care in labor or delivery 06/07/2021   Nausea and vomiting after administration of anesthetic agent 02/19/2021   Patient reports severe nausea and vomiting after epidural in first pregnancy in 2009. Has not had an epidural in past 2 pregnancies but desires epidural again for this pregnancy. May require prophylactic nausea medication.    Vaginal delivery 06/09/2021   Past Surgical History: Past Surgical History:  Procedure Laterality Date   NO PAST SURGERIES     Medication List:  Current Outpatient Medications  Medication Sig Dispense Refill   Blood Pressure Monitoring (BLOOD PRESSURE KIT) DEVI 1 Device by Does not apply route as needed.  (Patient not taking: Reported on 07/17/2021) 1 each 0   clobetasol ointment (TEMOVATE) 0.05 % Apply to knuckle joints and layer under emollient lotion. 30 g 2   Emollient (AQUAPHOR OINTMENT BODY) AERO Use 2 times daily over hands. 105 g 2   No current facility-administered medications for this visit.   Allergies: Allergies  Allergen Reactions   Fish Allergy Itching   Peanut Butter Flavor Itching   Shrimp (Diagnostic) Itching   Social History: Social History   Socioeconomic History   Marital status: Married    Spouse name: Not on file   Number of children: Not on file   Years of education: Not on file   Highest education level: Not on file  Occupational History   Not on file  Tobacco Use   Smoking status: Never   Smokeless tobacco: Never  Vaping Use   Vaping Use: Never used  Substance and Sexual Activity   Alcohol use: No   Drug use: No   Sexual activity: Yes    Birth control/protection: None  Other Topics Concern   Not on file  Social History Narrative   Not on file   Social Determinants of Health   Financial Resource Strain: Not on file  Food Insecurity: No Food Insecurity (05/16/2021)   Hunger Vital Sign    Worried About Running Out of Food in the Last Year: Never true    Ran Out of Food in the Last Year: Never true  Transportation Needs: No Transportation Needs (05/16/2021)   PRAPARE - Hydrologist (Medical): No    Lack of Transportation (Non-Medical): No  Physical Activity: Not on file  Stress: Not on file  Social Connections: Not on file   Lives in a ***. Smoking: *** Occupation: ***  Environmental History: Water Damage/mildew in the house: Estate agent  in the family room: {Blank single:19197::"yes","no"} Carpet in the bedroom: {Blank single:19197::"yes","no"} Heating: {Blank single:19197::"electric","gas","heat pump"} Cooling: {Blank single:19197::"central","window","heat pump"} Pet: {Blank  single:19197::"yes ***","no"}  Family History: Family History  Problem Relation Age of Onset   Diabetes Mother    Hypertension Mother    Hypertension Maternal Grandmother    Heart disease Maternal Grandmother    Problem                               Relation Asthma                                   *** Eczema                                *** Food allergy                          *** Allergic rhino conjunctivitis     ***  Review of Systems  Constitutional:  Negative for appetite change, chills, fever and unexpected weight change.  HENT:  Negative for congestion and rhinorrhea.   Eyes:  Negative for itching.  Respiratory:  Negative for cough, chest tightness, shortness of breath and wheezing.   Cardiovascular:  Negative for chest pain.  Gastrointestinal:  Negative for abdominal pain.  Genitourinary:  Negative for difficulty urinating.  Skin:  Negative for rash.  Neurological:  Negative for headaches.    Objective: There were no vitals taken for this visit. There is no height or weight on file to calculate BMI. Physical Exam Vitals and nursing note reviewed.  Constitutional:      Appearance: Normal appearance. She is well-developed.  HENT:     Head: Normocephalic and atraumatic.     Right Ear: Tympanic membrane and external ear normal.     Left Ear: Tympanic membrane and external ear normal.     Nose: Nose normal.     Mouth/Throat:     Mouth: Mucous membranes are moist.     Pharynx: Oropharynx is clear.  Eyes:     Conjunctiva/sclera: Conjunctivae normal.  Cardiovascular:     Rate and Rhythm: Normal rate and regular rhythm.     Heart sounds: Normal heart sounds. No murmur heard.    No friction rub. No gallop.  Pulmonary:     Effort: Pulmonary effort is normal.     Breath sounds: Normal breath sounds. No wheezing, rhonchi or rales.  Musculoskeletal:     Cervical back: Neck supple.  Skin:    General: Skin is warm.     Findings: No rash.  Neurological:     Mental  Status: She is alert and oriented to person, place, and time.  Psychiatric:        Behavior: Behavior normal.    The plan was reviewed with the patient/family, and all questions/concerned were addressed.  It was my pleasure to see [place] today and participate in her care. Please feel free to contact me with any questions or concerns.  Sincerely,  Rexene Alberts, DO Allergy & Immunology  [facility] office: [place] office: [phone]

## 2022-05-15 ENCOUNTER — Telehealth: Payer: Self-pay

## 2022-05-15 ENCOUNTER — Ambulatory Visit (INDEPENDENT_AMBULATORY_CARE_PROVIDER_SITE_OTHER): Payer: Medicaid Other | Admitting: Allergy

## 2022-05-15 ENCOUNTER — Encounter: Payer: Self-pay | Admitting: Allergy

## 2022-05-15 VITALS — BP 112/78 | HR 78 | Temp 97.6°F | Resp 18 | Ht 66.5 in | Wt 158.4 lb

## 2022-05-15 DIAGNOSIS — J3089 Other allergic rhinitis: Secondary | ICD-10-CM | POA: Diagnosis not present

## 2022-05-15 DIAGNOSIS — L301 Dyshidrosis [pompholyx]: Secondary | ICD-10-CM | POA: Diagnosis not present

## 2022-05-15 DIAGNOSIS — R0602 Shortness of breath: Secondary | ICD-10-CM | POA: Diagnosis not present

## 2022-05-15 DIAGNOSIS — T781XXA Other adverse food reactions, not elsewhere classified, initial encounter: Secondary | ICD-10-CM

## 2022-05-15 DIAGNOSIS — L2089 Other atopic dermatitis: Secondary | ICD-10-CM

## 2022-05-15 DIAGNOSIS — T781XXD Other adverse food reactions, not elsewhere classified, subsequent encounter: Secondary | ICD-10-CM | POA: Insufficient documentation

## 2022-05-15 DIAGNOSIS — J302 Other seasonal allergic rhinitis: Secondary | ICD-10-CM | POA: Insufficient documentation

## 2022-05-15 MED ORDER — EUCRISA 2 % EX OINT: 1.0000 | TOPICAL_OINTMENT | Freq: Two times a day (BID) | CUTANEOUS | 5 refills | Status: DC | PRN

## 2022-05-15 MED ORDER — EPICERAM EX EMUL: CUTANEOUS | 3 refills | Status: AC

## 2022-05-15 MED ORDER — EPINEPHRINE 0.3 MG/0.3ML IJ SOAJ: 0.3000 mg | INTRAMUSCULAR | 1 refills | Status: AC | PRN

## 2022-05-15 NOTE — Telephone Encounter (Signed)
PA received through [facility] from [facility] for Eucrisa 2% ointment.  PA submitted and has been APPROVED from 05/15/2022-05/16/2023.  Key: [id number] - PA Case ID: [id number]

## 2022-05-15 NOTE — Assessment & Plan Note (Signed)
Patient has been dealing with itchy, dry rash mainly on the hands and face for 10+ years.  It flares a few times per month and has been using clobetasol ointment with good benefit.  Sometimes gets worse after eating fish, shrimp or peanut butter.  Patient is a [employer] manager.  No prior work-up.  Today's skin testing showed: Positive to dust mites only. Negative to select foods.  . 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  . See below for proper skin care. . Use Eucrisa (crisaborole) 2% ointment twice a day on mild rash flares on the face and body. This is a non-steroid ointment. Samples given. . If it burns, place the medication in the refrigerator.  Marland Kitchen Apply a thin layer of moisturizer and then apply the Eucrisa on top of it. . Use Epicerum twice a day as moisturizer - sample given o If not covered let us know.  . Recommend patch testing next . Try to wear gloves when using chemicals at work.  . Take an allergy medication every day. o Use over the counter antihistamines such as Zyrtec (cetirizine), Claritin (loratadine), Allegra (fexofenadine), or Xyzal (levocetirizine) daily as needed. May take twice a day during allergy flares. May switch antihistamines every few months. . Handout given about Dupixent injections.

## 2022-05-15 NOTE — Assessment & Plan Note (Signed)
Episodes of shortness of breath with a lot of congestion.  Denies any prior asthma diagnosis/inhaler use.  Today's spirometry showed some restriction with 5% improvement in FEV1 and 52% improvement in small airways post bronchodilator treatment. Clinically feeling unchanged.   Monitor symptoms.

## 2022-05-15 NOTE — Assessment & Plan Note (Signed)
Symptoms flare in the fall and takes over-the-counter Claritin with some benefit.  Today's skin prick testing was positive to dust mites only.  Get blood work instead of intradermal testing as already ordering blood work for the below food issues.  Start environmental control measures as below.  Use over the counter antihistamines such as Zyrtec (cetirizine), Claritin (loratadine), Allegra (fexofenadine), or Xyzal (levocetirizine) daily as needed. May take twice a day during allergy flares. May switch antihistamines every few months.

## 2022-05-15 NOTE — Assessment & Plan Note (Signed)
Melanie Peterson, tilapia causes itching after 1 hour of ingestion.  Tilapia also causes issues with her breathing.  Shrimp causes pruritus with contact.  Today's skin prick testing was negative to select foods. . 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 . Continue to avoid seafood and peanuts. . Get bloodwork . I have prescribed epinephrine injectable device and demonstrated proper use. For mild symptoms you can take over the counter antihistamines such as Benadryl and monitor symptoms closely. If symptoms worsen or if you have severe symptoms including breathing issues, throat closure, significant swelling, whole body hives, severe diarrhea and vomiting, lightheadedness then inject epinephrine and seek immediate medical care afterwards. . Emergency action plan given.

## 2022-05-15 NOTE — Patient Instructions (Addendum)
Today's skin testing showed: Positive to dust mites only. Negative to select foods.   Results given.  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 See below for proper skin care. Use Eucrisa (crisaborole) 2% ointment twice a day on mild rash flares on the face and body. This is a non-steroid ointment. Samples given. If it burns, place the medication in the refrigerator.  Apply a thin layer of moisturizer and then apply the Eucrisa on top of it. Use Epicerum twice a day as moisturizer - sample given If not covered let us know.  Recommend patch testing next Patches are best placed on Monday with return to office on Wednesday and Friday of same week for readings.  Patches once placed should not get wet.  You do not have to stop any medications for patch testing but should not be on oral prednisone. You can schedule a patch testing visit when convenient for your schedule.   Try to wear gloves when using chemicals. Take an allergy medication every day.  Use over the counter antihistamines such as Zyrtec (cetirizine), Claritin (loratadine), Allegra (fexofenadine), or Xyzal (levocetirizine) daily as needed. May take twice a day during allergy flares. May switch antihistamines every few months.  Environmental allergies Start environmental control measures as below. Use over the counter antihistamines such as Zyrtec (cetirizine), Claritin (loratadine), Allegra (fexofenadine), or Xyzal (levocetirizine) daily as needed. May take twice a day during allergy flares. May switch antihistamines every few months.  Food The major allergen in shellfish allergy is tropomyosin, a pan-allergen that is also found in house dust mites and cockroaches which can cause cross reactivity and cause the itching symptoms when touching shellfish. Continue to avoid seafood and peanuts. Get bloodwork We are ordering labs, so please allow 1-2 weeks for the results to come back. With the newly implemented Cures Act, the labs  might be visible to you at the same time that they become visible to me. However, I will not address the results until all of the results are back, so please be patient.  In the meantime, continue recommendations in your patient instructions, including avoidance measures (if applicable), until you hear from me. I have prescribed epinephrine injectable device and demonstrated proper use. For mild symptoms you can take over the counter antihistamines such as Benadryl and monitor symptoms closely. If symptoms worsen or if you have severe symptoms including breathing issues, throat closure, significant swelling, whole body hives, severe diarrhea and vomiting, lightheadedness then inject epinephrine and seek immediate medical care afterwards. Emergency action plan given.  Follow up in 2 months or sooner if needed.    Skin care recommendations  Bath time: Always use lukewarm water. AVOID very hot or cold water. Keep bathing time to 5-10 minutes. Do NOT use bubble bath. Use a mild soap and use just enough to wash the dirty areas. Do NOT scrub skin vigorously.  After bathing, pat dry your skin with a towel. Do NOT rub or scrub the skin.  Moisturizers and prescriptions:  ALWAYS apply moisturizers immediately after bathing (within 3 minutes). This helps to lock-in moisture. Use the moisturizer several times a day over the whole body. Good summer moisturizers include: Aveeno, CeraVe, Cetaphil. Good winter moisturizers include: Aquaphor, Vaseline, Cerave, Cetaphil, Eucerin, Vanicream. When using moisturizers along with medications, the moisturizer should be applied about one hour after applying the medication to prevent diluting effect of the medication or moisturize around where you applied the medications. When not using medications, the moisturizer can be continued twice daily  as maintenance.  Laundry and clothing: Avoid laundry products with added color or perfumes. Use unscented hypo-allergenic  laundry products such as Tide free, Cheer free & gentle, and All free and clear.  If the skin still seems dry or sensitive, you can try double-rinsing the clothes. Avoid tight or scratchy clothing such as wool. Do not use fabric softeners or dyer sheets.  Control of House Dust Mite Allergen Dust mite allergens are a common trigger of allergy and asthma symptoms. While they can be found throughout the house, these microscopic creatures thrive in warm, humid environments such as bedding, upholstered furniture and carpeting. Because so much time is spent in the bedroom, it is essential to reduce mite levels there.  Encase pillows, mattresses, and box springs in special allergen-proof fabric covers or airtight, zippered plastic covers.  Bedding should be washed weekly in hot water (130 F) and dried in a hot dryer. Allergen-proof covers are available for comforters and pillows that can't be regularly washed.  Wash the allergy-proof covers every few months. Minimize clutter in the bedroom. Keep pets out of the bedroom.  Keep humidity less than 50% by using a dehumidifier or air conditioning. You can buy a humidity measuring device called a hygrometer to monitor this.  If possible, replace carpets with hardwood, linoleum, or washable area rugs. If that's not possible, vacuum frequently with a vacuum that has a HEPA filter. Remove all upholstered furniture and non-washable window drapes from the bedroom. Remove all non-washable stuffed toys from the bedroom.  Wash stuffed toys weekly.

## 2022-05-19 LAB — ALLERGEN PROFILE, FOOD-FISH
Allergen Mackerel IgE: <0.1 kU/L
Allergen Salmon IgE: <0.1 kU/L
Allergen Trout IgE: <0.1 kU/L
Allergen Walley Pike IgE: <0.1 kU/L
Codfish IgE: <0.1 kU/L
Halibut IgE: <0.1 kU/L
Tuna: <0.1 kU/L

## 2022-05-19 LAB — ALLERGEN PROFILE, SHELLFISH
Clam IgE: <0.1 kU/L
F023-IgE Crab: <0.1 kU/L
F080-IgE Lobster: <0.1 kU/L
F290-IgE Oyster: <0.1 kU/L
Scallop IgE: 1.17 kU/L — AB
Shrimp IgE: 3.9 kU/L — AB

## 2022-05-19 LAB — PEANUT COMPONENTS
F352-IgE Ara h 8: 0.16 kU/L — AB
F422-IgE Ara h 1: <0.1 kU/L
F423-IgE Ara h 2: <0.1 kU/L
F424-IgE Ara h 3: <0.1 kU/L
F427-IgE Ara h 9: <0.1 kU/L
F447-IgE Ara h 6: <0.1 kU/L

## 2022-05-19 LAB — IGE NUT PROF. W/COMPONENT RFLX
F017-IgE Hazelnut (Filbert): <0.1 kU/L
F018-IgE Brazil Nut: <0.1 kU/L
F020-IgE Almond: <0.1 kU/L
F202-IgE Cashew Nut: <0.1 kU/L
F203-IgE Pistachio Nut: <0.1 kU/L
F256-IgE Walnut: <0.1 kU/L
Macadamia Nut, IgE: 0.13 kU/L — AB
Peanut, IgE: 0.17 kU/L — AB
Pecan Nut IgE: <0.1 kU/L

## 2022-05-19 LAB — ALLERGENS W/TOTAL IGE AREA 2
Alternaria Alternata IgE: <0.1 kU/L
Aspergillus Fumigatus IgE: <0.1 kU/L
Bermuda Grass IgE: <0.1 kU/L
Cat Dander IgE: <0.1 kU/L
Cedar, Mountain IgE: 0.11 kU/L — AB
Cladosporium Herbarum IgE: <0.1 kU/L
Cockroach, German IgE: 0.58 kU/L — AB
Common Silver Birch IgE: 0.13 kU/L — AB
Cottonwood IgE: <0.1 kU/L
D Farinae IgE: 33 kU/L — AB
D Pteronyssinus IgE: 32.8 kU/L — AB
Dog Dander IgE: 0.2 kU/L — AB
Elm, American IgE: 0.76 kU/L — AB
IgE (Immunoglobulin E), Serum: 646 IU/mL — ABNORMAL HIGH (ref 6–495)
Johnson Grass IgE: 0.11 kU/L — AB
Maple/Box Elder IgE: <0.1 kU/L
Mouse Urine IgE: 0.59 kU/L — AB
Oak, White IgE: <0.1 kU/L
Pecan, Hickory IgE: 0.83 kU/L — AB
Penicillium Chrysogen IgE: <0.1 kU/L
Pigweed, Rough IgE: 0.11 kU/L — AB
Ragweed, Short IgE: 0.21 kU/L — AB
Sheep Sorrel IgE Qn: <0.1 kU/L
Timothy Grass IgE: <0.1 kU/L
White Mulberry IgE: <0.1 kU/L

## 2022-05-19 LAB — ALLERGEN COMPONENT COMMENTS

## 2022-05-30 ENCOUNTER — Encounter: Payer: Medicaid Other | Admitting: Internal Medicine

## 2022-06-03 ENCOUNTER — Encounter: Payer: Self-pay | Admitting: Student

## 2022-06-03 ENCOUNTER — Other Ambulatory Visit: Payer: Self-pay

## 2022-06-03 ENCOUNTER — Ambulatory Visit: Payer: Medicaid Other | Admitting: Student

## 2022-06-03 VITALS — BP 114/76 | HR 61 | Temp 98.2°F | Ht 66.0 in | Wt 166.8 lb

## 2022-06-03 DIAGNOSIS — L02411 Cutaneous abscess of right axilla: Secondary | ICD-10-CM | POA: Diagnosis not present

## 2022-06-03 DIAGNOSIS — J358 Other chronic diseases of tonsils and adenoids: Secondary | ICD-10-CM | POA: Diagnosis not present

## 2022-06-03 MED ORDER — SULFAMETHOXAZOLE-TRIMETHOPRIM 400-80 MG PO TABS: 1.0000 | ORAL_TABLET | Freq: Two times a day (BID) | ORAL | 0 refills | Status: AC

## 2022-06-03 NOTE — Progress Notes (Signed)
Established Patient Office Visit  Subjective   Patient ID: Melanie Peterson, female    DOB: [birth year]  Age: 41 y.o. MRN: [id number]  Chief Complaint  Patient presents with   Follow-up    Melanie Peterson is a 41 y.o. person living with a history listed below who present to clinic for follow of tonsillolith and right axillary abscess. Please refer to problem based charting for further details and assessment and plan of current problem and chronic medical conditions.    Past Medical History:  Diagnosis Date   AMA (advanced maternal age) multigravida 35+    Angio-edema    Eczema    GBS bacteriuria 02/19/2021   Needs treatment in labor   H/O postpartum hemorrhage, currently pregnant    History of macrosomia in infant in prior pregnancy, currently pregnant 02/10/2017   Ten pounds per patient.  Pushed 3 times.  No shoulder dystocia.  Nml fundal height at 36 weeks.   Indication for care in labor or delivery 06/07/2021   Nausea and vomiting after administration of anesthetic agent 02/19/2021   Patient reports severe nausea and vomiting after epidural in first pregnancy in 2009. Has not had an epidural in past 2 pregnancies but desires epidural again for this pregnancy. May require prophylactic nausea medication.    Vaginal delivery 06/09/2021    ROS: negative as per HPI    Objective:     BP 114/76 (BP Location: Left Arm, Patient Position: Sitting, Cuff Size: Normal)   Pulse 61   Temp 98.2 F (36.8 C) (Oral)   Ht 5\' 6"  (1.676 m)   Wt 166 lb 12.8 oz (75.7 kg)   SpO2 100%   BMI 26.92 kg/m  BP Readings from Last 3 Encounters:  06/03/22 114/76  05/15/22 112/78  02/18/22 104/60      Physical Exam Constitutional:      Appearance: Normal appearance.  HENT:     Mouth/Throat:     Mouth: Mucous membranes are moist.     Pharynx: Oropharynx is clear. No oropharyngeal exudate or posterior oropharyngeal erythema.     Tonsils: No tonsillar exudate or tonsillar abscesses.      Comments: Enlarged tonsils  Cardiovascular:     Rate and Rhythm: Normal rate and regular rhythm.  Pulmonary:     Effort: Pulmonary effort is normal.     Breath sounds: No rhonchi or rales.  Abdominal:     General: Abdomen is flat. Bowel sounds are normal. There is no distension.     Palpations: Abdomen is soft.     Tenderness: There is no abdominal tenderness.  Musculoskeletal:        General: Normal range of motion.     Cervical back: Full passive range of motion without pain.     Right lower leg: No edema.     Left lower leg: No edema.  Lymphadenopathy:     Cervical: Cervical adenopathy present.  Skin:    General: Skin is warm and dry.     Capillary Refill: Capillary refill takes less than 2 seconds.     Comments: 2x3 cm abscess of the right axilla with scan amount of purulent drainage   Neurological:     General: No focal deficit present.     Mental Status: She is alert and oriented to person, place, and time.  Psychiatric:        Mood and Affect: Mood normal.        Behavior: Behavior normal.     No results found for  any visits on 06/03/22.   The ASCVD Risk score (Arnett DK, et al., 2019) failed to calculate for the following reasons:   The 2019 ASCVD risk score is only valid for ages 36 to 35    Assessment & Plan:   Problem List Items Addressed This Visit       Respiratory   Tonsillolith - Primary    Presents for follow up today. Still feels like she is having bad breath and right sided sensation in her throat. Occasionally sees tonsilliths . Has been gargling, brushing, and flossing daily. Does not feel this is helping would like to discuss tonsillectomy with ENT.  -Referral to ENT.      Relevant Orders   Ambulatory referral to ENT     Other   Abscess of right axilla    Swelling and pain the right axilla for 1 week. Thinks she may have nicked herself while shaving. Has similar episode 1 year ago after giver birth to son, does not feel this is a recurrent  issue. Small amount of drainage last night. 2x3 cm area of induration and fluctuance, appear open with scan amount of purulent drainage. No systemic signs of infection. Offered I&D in office today but she declined, feels it will improve on its own as is already draining.  -warm compressess -Bactrim for 5 days -return precautions given for worsening pain and infection        Return in about 4 weeks (around 07/01/2022), or if symptoms worsen or fail to improve.    Melanie Simmonds, MD

## 2022-06-03 NOTE — Patient Instructions (Addendum)
Tonsillolith I have made a referral to ENT to discuss tonsillectomy   Abscess of the right axilla Please take bactrim 1 tablet for 5 days Use warm compresses to the area Follow up if not improving or worsening

## 2022-06-03 NOTE — Assessment & Plan Note (Signed)
Swelling and pain the right axilla for 1 week. Thinks she may have nicked herself while shaving. Has similar episode 1 year ago after giver birth to son, does not feel this is a recurrent issue. Small amount of drainage last night. 2x3 cm area of induration and fluctuance, appear open with scan amount of purulent drainage. No systemic signs of infection. Offered I&D in office today but she declined, feels it will improve on its own as is already draining.  -warm compressess -Bactrim for 5 days -return precautions given for worsening pain and infection

## 2022-06-03 NOTE — Assessment & Plan Note (Signed)
Presents for follow up today. Still feels like she is having bad breath and right sided sensation in her throat. Occasionally sees tonsilliths . Has been gargling, brushing, and flossing daily. Does not feel this is helping would like to discuss tonsillectomy with ENT.  -Referral to ENT.

## 2022-06-12 NOTE — Progress Notes (Signed)
Internal Medicine Clinic Attending ? ?Case discussed with Dr. Liang  At the time of the visit.  We reviewed the resident?s history and exam and pertinent patient test results.  I agree with the assessment, diagnosis, and plan of care documented in the resident?s note. ? ?

## 2022-07-16 NOTE — Progress Notes (Unsigned)
Follow Up Note  RE: Melanie Peterson MRN: [id number] DOB: Nov 10, 1980 Date of Office Visit: 07/17/2022  Referring provider: Christiana Fuchs, DO Primary care provider: Christiana Fuchs, DO  Chief Complaint: No chief complaint on file.  History of Present Illness: I had the pleasure of seeing Melanie Peterson for a follow up visit at the [facility] on 07/16/2022. She is a 42 y.o. female, who is being followed for dyshidrotic eczema, allergic rhinitis, adverse food reaction and shortness of breath. Her previous allergy office visit was on 05/15/2022 with Dr. Maudie Mercury. Today is a regular follow up visit.  Reviewed blood work.  Environmental panel was positive to dust mites, cockroach, tree pollen and mouse.  And borderline positive to dog, grass and weed pollen.   Positive to shrimp and scallop.  Borderline to peanut and macadamia nut.  Negative to finned fish.   Continue to avoid seafood and peanut for now. Will discuss reintroduction at next visit.  Dyshidrotic eczema Patient has been dealing with itchy, dry rash mainly on the hands and face for 10+ years.  It flares a few times per month and has been using clobetasol ointment with good benefit.  Sometimes gets worse after eating fish, shrimp or peanut butter.  Patient is a [employer].  No prior work-up. Today's skin testing showed: Positive to dust mites only. Negative to select foods.  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  See below for proper skin care. Use Eucrisa (crisaborole) 2% ointment twice a day on mild rash flares on the face and body. This is a non-steroid ointment. Samples given. If it burns, place the medication in the refrigerator.  Apply a thin layer of moisturizer and then apply the Eucrisa on top of it. Use Epicerum twice a day as moisturizer - sample given If not covered let us know.  Recommend patch testing next Try to wear gloves when using chemicals at work.  Take an allergy medication every  day. Use over the counter antihistamines such as Zyrtec (cetirizine), Claritin (loratadine), Allegra (fexofenadine), or Xyzal (levocetirizine) daily as needed. May take twice a day during allergy flares. May switch antihistamines every few months. Handout given about Dupixent injections.   Other allergic rhinitis Symptoms flare in the fall and takes over-the-counter Claritin with some benefit. Today's skin prick testing was positive to dust mites only. Get blood work instead of intradermal testing as already ordering blood work for the below food issues. Start environmental control measures as below. Use over the counter antihistamines such as Zyrtec (cetirizine), Claritin (loratadine), Allegra (fexofenadine), or Xyzal (levocetirizine) daily as needed. May take twice a day during allergy flares. May switch antihistamines every few months.   Other adverse food reactions, not elsewhere classified, subsequent encounter Peanut, tilapia causes itching after 1 hour of ingestion.  Tilapia also causes issues with her breathing.  Shrimp causes pruritus with contact. Today's skin prick testing was negative to select foods. 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 Continue to avoid seafood and peanuts. Get bloodwork I have prescribed epinephrine injectable device and demonstrated proper use. For mild symptoms you can take over the counter antihistamines such as Benadryl and monitor symptoms closely. If symptoms worsen or if you have severe symptoms including breathing issues, throat closure, significant swelling, whole body hives, severe diarrhea and vomiting, lightheadedness then inject epinephrine  and seek immediate medical care afterwards. Emergency action plan given.   Shortness of breath Episodes of shortness of breath with a lot  of congestion.  Denies any prior asthma diagnosis/inhaler use. Today's spirometry showed some restriction with 5% improvement in FEV1 and 52% improvement in small airways post bronchodilator treatment. Clinically feeling unchanged.  Monitor symptoms.   Return in about 2 months (around 07/15/2022).  Assessment and Plan: Melanie Peterson is a 42 y.o. female with: No problem-specific Assessment & Plan notes found for this encounter.  No follow-ups on file.  No orders of the defined types were placed in this encounter.  Lab Orders  No laboratory test(s) ordered today    Diagnostics: Spirometry:  Tracings reviewed. Her effort: {Blank single:19197::"Good reproducible efforts.","It was hard to get consistent efforts and there is a question as to whether this reflects a maximal maneuver.","Poor effort, data can not be interpreted."} FVC: ***L FEV1: ***L, ***% predicted FEV1/FVC ratio: ***% Interpretation: {Blank single:19197::"Spirometry consistent with mild obstructive disease","Spirometry consistent with moderate obstructive disease","Spirometry consistent with severe obstructive disease","Spirometry consistent with possible restrictive disease","Spirometry consistent with mixed obstructive and restrictive disease","Spirometry uninterpretable due to technique","Spirometry consistent with normal pattern","No overt abnormalities noted given today's efforts"}.  Please see scanned spirometry results for details.  Skin Testing: {Blank single:19197::"Select foods","Environmental allergy panel","Environmental allergy panel and select foods","Food allergy panel","None","Deferred due to recent antihistamines use"}. *** Results discussed with patient/family.   Medication List:  Current Outpatient Medications  Medication Sig Dispense Refill   Blood Pressure Monitoring (BLOOD PRESSURE KIT) DEVI 1 Device by Does not apply route as needed. 1 each 0   clobetasol ointment (TEMOVATE) 0.05 % Apply to knuckle joints  and layer under emollient lotion. 30 g 2   Crisaborole (EUCRISA) 2 % OINT Apply 1 Application topically 2 (two) times daily as needed (mild rash). 60 g 5   Dermatological Products, Misc. [facility]) lotion Apply twice a day to the body as a moisturizer 225 g 3   Emollient (AQUAPHOR OINTMENT BODY) AERO Use 2 times daily over hands. 105 g 2   EPINEPHrine 0.3 mg/0.3 mL IJ SOAJ injection Inject 0.3 mg into the muscle as needed for anaphylaxis. 2 each 1   No current facility-administered medications for this visit.   Allergies: Allergies  Allergen Reactions   Fish Allergy Itching   Peanut Butter Flavor Itching   Shrimp (Diagnostic) Itching   I reviewed her past medical history, social history, family history, and environmental history and no significant changes have been reported from her previous visit.  Review of Systems  Constitutional:  Negative for appetite change, chills, fever and unexpected weight change.  HENT:  Negative for congestion and rhinorrhea.   Eyes:  Negative for itching.  Respiratory:  Negative for cough, chest tightness, shortness of breath and wheezing.   Cardiovascular:  Negative for chest pain.  Gastrointestinal:  Negative for abdominal pain.  Genitourinary:  Negative for difficulty urinating.  Skin:  Positive for rash.  Allergic/Immunologic: Positive for environmental allergies and food allergies.  Neurological:  Negative for headaches.    Objective: There were no vitals taken for this visit. There is no height or weight on file to calculate BMI. Physical Exam Vitals and nursing note reviewed.  Constitutional:      Appearance: Normal appearance. She is well-developed.     Comments: Strong perfume noted during exam.  HENT:     Head: Normocephalic and atraumatic.     Right Ear: Tympanic membrane and external ear normal.     Left Ear:  Tympanic membrane and external ear normal.     Nose: Nose normal.     Mouth/Throat:     Mouth: Mucous membranes are moist.      Pharynx: Oropharynx is clear.  Eyes:     Conjunctiva/sclera: Conjunctivae normal.  Cardiovascular:     Rate and Rhythm: Normal rate and regular rhythm.     Heart sounds: Normal heart sounds. No murmur heard.    No friction rub. No gallop.  Pulmonary:     Effort: Pulmonary effort is normal.     Breath sounds: Normal breath sounds. No wheezing, rhonchi or rales.  Musculoskeletal:     Cervical back: Neck supple.  Skin:    General: Skin is warm and dry.     Findings: No rash.     Comments: Very dry skin on the hands b/l. Some fissured skin noted.   Neurological:     Mental Status: She is alert and oriented to person, place, and time.  Psychiatric:        Behavior: Behavior normal.    Previous notes and tests were reviewed. The plan was reviewed with the patient/family, and all questions/concerned were addressed.  It was my pleasure to see Melanie Peterson today and participate in her care. Please feel free to contact me with any questions or concerns.  Sincerely,  Rexene Alberts, DO Allergy & Immunology  [facility] office: [place] office: [phone]

## 2022-07-17 ENCOUNTER — Other Ambulatory Visit: Payer: Self-pay

## 2022-07-17 ENCOUNTER — Ambulatory Visit (INDEPENDENT_AMBULATORY_CARE_PROVIDER_SITE_OTHER): Payer: Medicaid Other | Admitting: Allergy

## 2022-07-17 ENCOUNTER — Encounter: Payer: Self-pay | Admitting: Allergy

## 2022-07-17 VITALS — BP 100/78 | HR 84 | Temp 97.9°F | Resp 16 | Ht 66.0 in | Wt 162.5 lb

## 2022-07-17 DIAGNOSIS — L301 Dyshidrosis [pompholyx]: Secondary | ICD-10-CM | POA: Diagnosis not present

## 2022-07-17 DIAGNOSIS — L2089 Other atopic dermatitis: Secondary | ICD-10-CM

## 2022-07-17 DIAGNOSIS — J3089 Other allergic rhinitis: Secondary | ICD-10-CM | POA: Diagnosis not present

## 2022-07-17 DIAGNOSIS — J302 Other seasonal allergic rhinitis: Secondary | ICD-10-CM

## 2022-07-17 DIAGNOSIS — T781XXD Other adverse food reactions, not elsewhere classified, subsequent encounter: Secondary | ICD-10-CM | POA: Diagnosis not present

## 2022-07-17 NOTE — Assessment & Plan Note (Signed)
Past history - Symptoms flare in the fall and takes over-the-counter Claritin with some benefit. 2023 skin prick testing was positive to dust mites only. 2023 bloodwork positive to dust mites, cockroach, tree pollen and mouse.  And borderline positive to dog, grass and weed pollen. Interim history - asymptomatic with no daily meds. Continue environmental control measures as below. Use over the counter antihistamines such as Zyrtec (cetirizine), Claritin (loratadine), Allegra (fexofenadine), or Xyzal (levocetirizine) daily as needed. May take twice a day during allergy flares. May switch antihistamines every few months.

## 2022-07-17 NOTE — Assessment & Plan Note (Signed)
Past history - itchy, dry rash mainly on the hands and face for 10+ years.  It flares a few times per month and has been using clobetasol ointment with good benefit.  Sometimes gets worse after eating fish, shrimp or peanut butter.  Patient is a [employer] manager.  No prior work-up. 2023 skin testing showed: Positive to dust mites only. Negative to select foods.  Interim history - better but still has flares. Does not wear gloves at work, did not try antihistamines daily. Manageable with [place].  Continue proper skin care. Use Eucrisa (crisaborole) 2% ointment twice a day on mild rash flares on the hands.This is a non-steroid ointment.  Recommend patch testing next Try to wear gloves when using chemicals at work - especially when using the gel.

## 2022-07-17 NOTE — Assessment & Plan Note (Signed)
Past history - Peanut, tilapia causes itching after 1 hour of ingestion.  Tilapia also causes issues with her breathing.  Shrimp causes pruritus with contact. 2023 skin prick testing was negative to select foods. 2023 bloodwork positive to shrimp and scallop; borderline to peanut and macadamia nut. Negative to finned fish. Interim history - no reactions.  Continue to avoid shellfish. Okay to eat finned fish as before.  You may try peanuts and tree nuts at home - if you have symptoms then continue to avoid.  For mild symptoms you can take over the counter antihistamines such as Benadryl 1-2 tablets = 25-50mg .and monitor symptoms closely. If symptoms worsen or if you have severe symptoms including breathing issues, throat closure, significant swelling, whole body hives, severe diarrhea and vomiting, lightheadedness then inject epinephrine and seek immediate medical care afterwards. Action plan in place.

## 2022-07-17 NOTE — Patient Instructions (Addendum)
Dyshidrotic eczema Continue proper skin care. Use Eucrisa (crisaborole) 2% ointment twice a day on mild rash flares on the hands.This is a non-steroid ointment.  If it burns, place the medication in the refrigerator.  Apply a thin layer of moisturizer and then apply the Eucrisa on top of it. Recommend patch testing next Patches are best placed on Monday with return to office on Wednesday and Friday of same week for readings.  Patches once placed should not get wet.  You do not have to stop any medications for patch testing but should not be on oral prednisone. You can schedule a patch testing visit when convenient for your schedule.   Try to wear gloves when using chemicals at work - especially when using the gel.   Environmental allergies 2023 skin testing showed: Positive to dust mites only. 2023 bloodwork: Positive to dust mites, cockroach, tree pollen and mouse.  And borderline positive to dog, grass and weed pollen. Continue environmental control measures as below. Use over the counter antihistamines such as Zyrtec (cetirizine), Claritin (loratadine), Allegra (fexofenadine), or Xyzal (levocetirizine) daily as needed. May take twice a day during allergy flares. May switch antihistamines every few months.  Food 2023 bloodwork positive to shrimp, scallop and borderline positive to peanuts and macadamia nut.  Continue to avoid shellfish. Okay to eat finned fish as before.  You may try peanuts and tree nuts at home - if you have symptoms then continue to avoid.  For mild symptoms you can take over the counter antihistamines such as Benadryl 1-2 tablets = 25-50mg .and monitor symptoms closely. If symptoms worsen or if you have severe symptoms including breathing issues, throat closure, significant swelling, whole body hives, severe diarrhea and vomiting, lightheadedness then inject epinephrine and seek immediate medical care afterwards. Action plan in place.   Follow up in 6 months or sooner if  needed.    Skin care recommendations  Bath time: Always use lukewarm water. AVOID very hot or cold water. Keep bathing time to 5-10 minutes. Do NOT use bubble bath. Use a mild soap and use just enough to wash the dirty areas. Do NOT scrub skin vigorously.  After bathing, pat dry your skin with a towel. Do NOT rub or scrub the skin.  Moisturizers and prescriptions:  ALWAYS apply moisturizers immediately after bathing (within 3 minutes). This helps to lock-in moisture. Use the moisturizer several times a day over the whole body. Good summer moisturizers include: Aveeno, CeraVe, Cetaphil. Good winter moisturizers include: Aquaphor, Vaseline, Cerave, Cetaphil, Eucerin, Vanicream. When using moisturizers along with medications, the moisturizer should be applied about one hour after applying the medication to prevent diluting effect of the medication or moisturize around where you applied the medications. When not using medications, the moisturizer can be continued twice daily as maintenance.  Laundry and clothing: Avoid laundry products with added color or perfumes. Use unscented hypo-allergenic laundry products such as Tide free, Cheer free & gentle, and All free and clear.  If the skin still seems dry or sensitive, you can try double-rinsing the clothes. Avoid tight or scratchy clothing such as wool. Do not use fabric softeners or dyer sheets.  Control of House Dust Mite Allergen Dust mite allergens are a common trigger of allergy and asthma symptoms. While they can be found throughout the house, these microscopic creatures thrive in warm, humid environments such as bedding, upholstered furniture and carpeting. Because so much time is spent in the bedroom, it is essential to reduce mite levels there.  Encase  pillows, mattresses, and box springs in special allergen-proof fabric covers or airtight, zippered plastic covers.  Bedding should be washed weekly in hot water (130 F) and dried in a  hot dryer. Allergen-proof covers are available for comforters and pillows that can't be regularly washed.  Wash the allergy-proof covers every few months. Minimize clutter in the bedroom. Keep pets out of the bedroom.  Keep humidity less than 50% by using a dehumidifier or air conditioning. You can buy a humidity measuring device called a hygrometer to monitor this.  If possible, replace carpets with hardwood, linoleum, or washable area rugs. If that's not possible, vacuum frequently with a vacuum that has a HEPA filter. Remove all upholstered furniture and non-washable window drapes from the bedroom. Remove all non-washable stuffed toys from the bedroom.  Wash stuffed toys weekly.  Cockroach Allergen Avoidance Cockroaches are often found in the homes of densely populated urban areas, schools or commercial buildings, but these creatures can lurk almost anywhere. This does not mean that you have a dirty house or living area. Block all areas where roaches can enter the home. This includes crevices, wall cracks and windows.  Cockroaches need water to survive, so fix and seal all leaky faucets and pipes. Have an exterminator go through the house when your family and pets are gone to eliminate any remaining roaches. Keep food in lidded containers and put pet food dishes away after your pets are done eating. Vacuum and sweep the floor after meals, and take out garbage and recyclables. Use lidded garbage containers in the kitchen. Wash dishes immediately after use and clean under stoves, refrigerators or toasters where crumbs can accumulate. Wipe off the stove and other kitchen surfaces and cupboards regularly.  Reducing Pollen Exposure Pollen seasons: trees (spring), grass (summer) and ragweed/weeds (fall). Keep windows closed in your home and car to lower pollen exposure.  Install air conditioning in the bedroom and throughout the house if possible.  Avoid going out in dry windy days - especially early  morning. Pollen counts are highest between 5 - 10 AM and on dry, hot and windy days.  Save outside activities for late afternoon or after a heavy rain, when pollen levels are lower.  Avoid mowing of grass if you have grass pollen allergy. Be aware that pollen can also be transported indoors on people and pets.  Dry your clothes in an automatic dryer rather than hanging them outside where they might collect pollen.  Rinse hair and eyes before bedtime.  Pet Allergen Avoidance: Contrary to popular opinion, there are no "hypoallergenic" breeds of dogs or cats. That is because people are not allergic to an animal's hair, but to an allergen found in the animal's saliva, dander (dead skin flakes) or urine. Pet allergy symptoms typically occur within minutes. For some people, symptoms can build up and become most severe 8 to 12 hours after contact with the animal. People with severe allergies can experience reactions in public places if dander has been transported on the pet owners' clothing. Keeping an animal outdoors is only a partial solution, since homes with pets in the yard still have higher concentrations of animal allergens. Before getting a pet, ask your allergist to determine if you are allergic to animals. If your pet is already considered part of your family, try to minimize contact and keep the pet out of the bedroom and other rooms where you spend a great deal of time. As with dust mites, vacuum carpets often or replace carpet with  a hardwood floor, tile or linoleum. High-efficiency particulate air (HEPA) cleaners can reduce allergen levels over time. While dander and saliva are the source of cat and dog allergens, urine is the source of allergens from rabbits, hamsters, mice and [place] pigs; so ask a non-allergic family member to clean the animal's cage. If you have a pet allergy, talk to your allergist about the potential for allergy immunotherapy (allergy shots). This strategy can often  provide long-term relief.

## 2022-08-09 ENCOUNTER — Encounter: Payer: Self-pay | Admitting: Student

## 2022-08-13 ENCOUNTER — Encounter: Payer: Self-pay | Admitting: Internal Medicine

## 2022-08-13 ENCOUNTER — Ambulatory Visit: Payer: Medicaid Other | Admitting: Internal Medicine

## 2022-08-13 VITALS — BP 121/70 | HR 72 | Temp 98.3°F | Ht 66.0 in | Wt 160.2 lb

## 2022-08-13 DIAGNOSIS — J358 Other chronic diseases of tonsils and adenoids: Secondary | ICD-10-CM | POA: Diagnosis not present

## 2022-08-13 DIAGNOSIS — G44219 Episodic tension-type headache, not intractable: Secondary | ICD-10-CM | POA: Diagnosis present

## 2022-08-13 DIAGNOSIS — J3501 Chronic tonsillitis: Secondary | ICD-10-CM | POA: Diagnosis not present

## 2022-08-13 DIAGNOSIS — R196 Halitosis: Secondary | ICD-10-CM | POA: Diagnosis not present

## 2022-08-13 DIAGNOSIS — G44209 Tension-type headache, unspecified, not intractable: Secondary | ICD-10-CM | POA: Insufficient documentation

## 2022-08-13 NOTE — Progress Notes (Signed)
   CC: headaches  HPI:Melanie Peterson is a 42 y.o. female who presents for evaluation of headache. Please see individual problem based A/P for details.  51 yof with hx atopic dermatitis and sickle cell trait (benign/asymptomatic)  Depression, PHQ-9: Based on the patients  [place] Visit from 10/25/2021 in [facility] [place] [facility]  PHQ-9 Total Score 4      score we have .  Past Medical History:  Diagnosis Date   AMA (advanced maternal age) multigravida 35+    Angio-edema    Eczema    GBS bacteriuria 02/19/2021   Needs treatment in labor   H/O postpartum hemorrhage, currently pregnant    History of macrosomia in infant in prior pregnancy, currently pregnant 02/10/2017   Ten pounds per patient.  Pushed 3 times.  No shoulder dystocia.  Nml fundal height at 36 weeks.   Indication for care in labor or delivery 06/07/2021   Nausea and vomiting after administration of anesthetic agent 02/19/2021   Patient reports severe nausea and vomiting after epidural in first pregnancy in 2009. Has not had an epidural in past 2 pregnancies but desires epidural again for this pregnancy. May require prophylactic nausea medication.    Vaginal delivery 06/09/2021   Review of Systems:   See HPI  Physical Exam: Vitals:   08/13/22 1008  BP: 121/70  Pulse: 72  Temp: 98.3 F (36.8 C)  TempSrc: Oral  Weight: 160 lb 3.2 oz (72.7 kg)  Height: 5\' 6"  (1.676 m)   General: NAD HEENT: Conjunctiva nl , antiicteric sclerae, moist mucous membranes, no exudate or erythema Cardiovascular: Normal rate, regular rhythm.  No murmurs, rubs, or gallops Pulmonary : Equal breath sounds, No wheezes, rales, or rhonchi Abdominal: soft, nontender,  bowel sounds present Ext: No edema in lower extremities, no tenderness to palpation of lower extremities.  Neuro: no focal deficits.   Assessment & Plan:   See Encounters Tab for problem based charting.  Patient  discussed with Dr. Angelia Mould

## 2022-08-13 NOTE — Assessment & Plan Note (Signed)
Patient reports intermittent headaches ongoing for the past year. Headaches onset after birth of her child. She describes bilateral discomfort extending from brow/forehead to occiput. Pain is not sharp. She is not having any rhinorrhea or photophobia but does occasionally lie down in dark room to help with headaches. She is not having any jaw pain. No sensory changes. No nausea or vomiting. Did mention that she thinks hr vision may have shifted a bit. 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 On top of this, she is taking classes to earn her degree as well. She drinks coffee continuously throughout the day and uses electronics late into the night for school work. Sleep hygiene is poor.  She will take tylenol to help alleviate the headaches which is effective. Uses tylenol on once or twice per week.   Neuro exam benign Vision 20/20 bilaterally on clinic screen  Headache pattern most consistent with Tension type. Not concerned for migraine or cluster headache. Patient is young without neurologic changes and has multiple better explanations for new headaches including excess caffeine, stress, and poor sleep. Do not feel imaging is warranted at this point.  Counseled patient on lifestyle changes including screen time and caffeine limitations, staying hydrated. She will continue to use tylenol as needed for headaches and ibuprofen if tylenol proves ineffective.

## 2022-08-13 NOTE — Patient Instructions (Addendum)
Dear Melanie Peterson,  Thank you for trusting [place] with your care today.   We discussed your headaches. Your headaches are most consistent with a tension type headache. These are typically brought on by stress, lack of sleep or poor sleep, dehydration, and excess caffeine. To best help mitigate these headaches, ensuring good sleep hygiene is essential. Limiting screen time prior to sleep is necessary. Avoiding caffeine at night can also help with sleep quality as well as the headaches themselves. If you have been drinking large amounts of caffeine and suddenly stop or decrease significantly, you may experience headaches from this initially, but this will improve over a couple of days. Making sure that you are staying hydrated with water will help.  Lastly, tylenol is a good medication to take for these headaches. If tylenol is not working, NSAIDs like ibuprofen can also help, though these must be used more sparingly.   If anything changes, please contact our office.

## 2022-08-16 NOTE — Progress Notes (Signed)
Internal Medicine Clinic Attending  Case discussed with the resident at the time of the visit.  We reviewed the resident's history and exam and pertinent patient test results.  I agree with the assessment, diagnosis, and plan of care documented in the resident's note.  

## 2022-09-24 ENCOUNTER — Other Ambulatory Visit: Payer: Self-pay | Admitting: Otolaryngology

## 2022-09-24 DIAGNOSIS — J3501 Chronic tonsillitis: Secondary | ICD-10-CM | POA: Diagnosis not present

## 2022-09-24 DIAGNOSIS — J351 Hypertrophy of tonsils: Secondary | ICD-10-CM | POA: Diagnosis not present

## 2022-09-24 DIAGNOSIS — J358 Other chronic diseases of tonsils and adenoids: Secondary | ICD-10-CM | POA: Diagnosis not present

## 2023-01-31 ENCOUNTER — Encounter: Payer: Medicaid Other | Admitting: Internal Medicine

## 2023-02-04 ENCOUNTER — Ambulatory Visit: Payer: Medicaid Other | Admitting: Internal Medicine

## 2023-02-04 DIAGNOSIS — L301 Dyshidrosis [pompholyx]: Secondary | ICD-10-CM | POA: Diagnosis not present

## 2023-02-04 NOTE — Progress Notes (Unsigned)
    I connected with  Melanie Peterson on 02/05/23 by telephone and verified that I am speaking with the correct person using two identifiers.   I discussed the limitations of evaluation and management by telemedicine. The patient expressed understanding and agreed to proceed.  CC: rash  This is a telephone encounter between [facility] and [facility] on 02/05/2023 for rash. The visit was conducted with the patient located at home and Rudene Christians at [facility]. The patient's identity was confirmed using their DOB and current address. The patient has consented to being evaluated through a telephone encounter and understands the associated risks (an examination cannot be done and the patient may need to come in for an appointment) / benefits (allows the patient to remain at home, decreasing exposure to coronavirus). I personally spent 8 minutes on medical discussion.   HPI:  [name] is a 42 y.o. with PMH as below.   Please see A&P for assessment of the patient's acute and chronic medical conditions.   Past Medical History:  Diagnosis Date   AMA (advanced maternal age) multigravida 35+    Angio-edema    Eczema    GBS bacteriuria 02/19/2021   Needs treatment in labor   H/O postpartum hemorrhage, currently pregnant    History of macrosomia in infant in prior pregnancy, currently pregnant 02/10/2017   Ten pounds per patient.  Pushed 3 times.  No shoulder dystocia.  Nml fundal height at 36 weeks.   Indication for care in labor or delivery 06/07/2021   Nausea and vomiting after administration of anesthetic agent 02/19/2021   Patient reports severe nausea and vomiting after epidural in first pregnancy in 2009. Has not had an epidural in past 2 pregnancies but desires epidural again for this pregnancy. May require prophylactic nausea medication.    Vaginal delivery 06/09/2021        Assessment & Plan:   Dyshidrotic eczema She continues have difficulty with itchy rash on her hands.  She was seen by allergist 01/24 and patch testing showed allergy to 29 different foods. 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 She has been using aquaphor and super high potency steroid cream without improvement. She does have a rash on one of her legs as well. She sent a picture in of her hand and there is plaque present across 3th and 4th fingers. -referral to derm -continue use of steroid cream and aquaphor    Patient discussed with Dr. Cleda Daub  Sonali Wivell, D.O. [facility]  PGY-2 Pager: [phone]  Phone: [phone] Date 02/05/2023  Time 7:38 AM

## 2023-02-04 NOTE — Patient Instructions (Signed)
Thank you, [name] for allowing [place] to provide your care today.   Rash I am referring you to dermatology. Please continue using steroid cream and aquaphor  Referrals ordered today:    Referral Orders         Ambulatory referral to Dermatology      I have ordered the following medication/changed the following medications:   Stop the following medications: There are no discontinued medications.   Start the following medications: No orders of the defined types were placed in this encounter.    We look forward to seeing you next time. Please call our clinic at [phone] if you have any questions or concerns. The best time to call is Monday-Friday from 9am-4pm, but there is someone available 24/7. If after hours or the weekend, call the main hospital number and ask for the Internal Medicine Resident On-Call. If you need medication refills, please notify your pharmacy one week in advance and they will send [place] a request.   Thank you for trusting me with your care. Wishing you the best!   Rudene Christians, DO [facility]

## 2023-02-05 NOTE — Assessment & Plan Note (Signed)
She continues have difficulty with itchy rash on her hands. She was seen by allergist 01/24 and patch testing showed allergy to 29 different foods. 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 She has been using aquaphor and super high potency steroid cream without improvement. She does have a rash on one of her legs as well. She sent a picture in of her hand and there is plaque present across 3th and 4th fingers. -referral to derm -continue use of steroid cream and aquaphor

## 2023-02-13 NOTE — Progress Notes (Signed)
Internal Medicine Clinic Attending  Case discussed with the resident at the time of the visit.  We reviewed the resident's history and exam and pertinent patient test results.  I agree with the assessment, diagnosis, and plan of care documented in the resident's note.  

## 2023-02-17 ENCOUNTER — Encounter (HOSPITAL_COMMUNITY): Payer: Self-pay

## 2023-02-17 ENCOUNTER — Ambulatory Visit (HOSPITAL_COMMUNITY)
Admission: EM | Admit: 2023-02-17 | Discharge: 2023-02-17 | Disposition: A | Discharge status: Home / Self Care | Payer: Medicaid Other | Attending: Physician Assistant | Admitting: Physician Assistant

## 2023-02-17 DIAGNOSIS — L309 Dermatitis, unspecified: Secondary | ICD-10-CM

## 2023-02-17 DIAGNOSIS — M7989 Other specified soft tissue disorders: Secondary | ICD-10-CM | POA: Diagnosis not present

## 2023-02-17 DIAGNOSIS — L301 Dyshidrosis [pompholyx]: Secondary | ICD-10-CM | POA: Diagnosis not present

## 2023-02-17 MED ORDER — FAMOTIDINE 40 MG PO TABS: 40.0000 mg | ORAL_TABLET | Freq: Every day | ORAL | 0 refills | Status: DC

## 2023-02-17 MED ORDER — CLOBETASOL PROPIONATE 0.05 % EX OINT
TOPICAL_OINTMENT | CUTANEOUS | 2 refills | Status: AC
Start: 2023-02-17 — End: ?

## 2023-02-17 MED ORDER — PREDNISONE 10 MG (21) PO TBPK: ORAL_TABLET | ORAL | 0 refills | Status: AC

## 2023-02-17 MED ORDER — AQUAPHOR OINTMENT BODY EX AERO
INHALATION_SPRAY | CUTANEOUS | 0 refills | Status: AC
Start: 2023-02-17 — End: ?

## 2023-02-17 MED ORDER — LORATADINE 10 MG PO TABS: 10.0000 mg | ORAL_TABLET | Freq: Every day | ORAL | 0 refills | Status: AC

## 2023-02-17 NOTE — ED Provider Notes (Signed)
[facility]    CSN: [id number] Arrival date & time: 02/17/23  4098      History   Chief Complaint Chief Complaint  Patient presents with   Allergic Reaction    HPI Melanie Peterson is a 42 y.o. female.   Patient presents today with a 4-day history of increasing swelling and associated pruritus involving bilateral hands.  She had a small area on her face but this has improved.  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  She is not currently followed by dermatology.  She has contacted her primary care in an effort to have a referral placed but has not yet been able to see them.  She has tried Claritin without improvement of symptoms.  She does report lots of cleaning but typically wears gloves to prevent ongoing exposure to moisture.  She denies any changes to personal hygiene products including soaps or detergents.  Denies any recent medication changes.  She does have a history of allergies to multiple foods and could have been exposed to peanuts but is unsure if this triggered her symptoms.  She denies any swelling of her throat, shortness of breath, muffled voice, nausea, vomiting.    Past Medical History:  Diagnosis Date   AMA (advanced maternal age) multigravida 35+    Angio-edema    Eczema    GBS bacteriuria 02/19/2021   Needs treatment in labor   H/O postpartum hemorrhage, currently pregnant    History of macrosomia in infant in prior pregnancy, currently pregnant 02/10/2017   Ten pounds per patient.  Pushed 3 times.  No shoulder dystocia.  Nml fundal height at 36 weeks.   Indication for care in labor or delivery 06/07/2021   Nausea and vomiting after administration of anesthetic agent 02/19/2021   Patient reports severe nausea and vomiting after epidural in first pregnancy in 2009. Has not had an epidural in past 2 pregnancies but desires epidural again for this pregnancy. May require prophylactic  nausea medication.    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/2022   Abscess of right axilla 06/03/2022   Dyshidrotic eczema 05/15/2022   Seasonal and perennial allergic rhinitis 05/15/2022   Food allergy 05/15/2022   Shortness of breath 05/15/2022   Atopic dermatitis 01/22/2022   Tonsillolith 01/22/2022   Globus sensation 01/22/2022   History of thrombocytopenia    Sickle cell trait (HCC) 02/10/2017   History of anesthesia complications 02/10/2017    Past Surgical History:  Procedure Laterality Date   NO PAST SURGERIES      OB History     Gravida  6   Para  5   Term  5   Preterm  0   AB  1   Living  5      SAB  1   IAB  0   Ectopic  0   Multiple  0   Live Births  5            Home Medications    Prior to Admission medications   Medication Sig Start Date End Date Taking? Authorizing Provider  Crisaborole (EUCRISA) 2 % OINT Apply 1 Application topically 2 (two) times daily as needed (mild rash). 05/15/22  Yes Ellamae Sia, DO  famotidine (PEPCID) 40 MG tablet Take 1 tablet (40 mg total) by mouth daily. 02/17/23  Yes Jessika Rothery, Noberto Retort, PA-C  loratadine (CLARITIN) 10 MG tablet Take 1 tablet (10 mg total) by mouth daily. 02/17/23  Yes Nicandro Perrault K, PA-C  predniSONE (STERAPRED UNI-PAK 21 TAB) 10 MG (21) TBPK tablet As directed 02/17/23  Yes Chrislyn Seedorf K, PA-C  Blood Pressure Monitoring (BLOOD PRESSURE KIT) DEVI 1 Device by Does not apply route as needed. 11/21/20   Hermina Staggers, MD  clobetasol ointment (TEMOVATE) 0.05 % Apply to knuckle joints and layer under emollient lotion. 02/17/23   Hava Massingale, Noberto Retort, PA-C  Dermatological Products, Misc. [facility]) lotion Apply twice a day to the body as a moisturizer 05/15/22   Ellamae Sia, DO  Emollient (AQUAPHOR OINTMENT BODY) AERO Use 2 times daily over hands. 02/17/23   Krishawn Vanderweele, Noberto Retort, PA-C  EPINEPHrine 0.3 mg/0.3 mL IJ SOAJ injection Inject 0.3 mg  into the muscle as needed for anaphylaxis. 05/15/22   Ellamae Sia, DO    Family History Family History  Problem Relation Age of Onset   Diabetes Mother    Hypertension Mother    Hypertension Maternal Grandmother    Heart disease Maternal Grandmother    Allergic rhinitis Neg Hx    Asthma Neg Hx    Eczema Neg Hx    Urticaria Neg Hx     Social History Social History   Tobacco Use   Smoking status: Never   Smokeless tobacco: Never  Vaping Use   Vaping status: Never Used  Substance Use Topics   Alcohol use: No   Drug use: No     Allergies   Fish allergy, Peanut butter flavor, and Shrimp (diagnostic)   Review of Systems Review of Systems  Constitutional:  Positive for activity change. Negative for appetite change, fatigue and fever.  HENT:  Negative for sore throat, trouble swallowing and voice change.   Respiratory:  Negative for cough and shortness of breath.   Cardiovascular:  Negative for chest pain, palpitations and leg swelling.  Gastrointestinal:  Negative for abdominal pain, diarrhea, nausea and vomiting.  Skin:  Positive for rash and wound. Negative for color change.     Physical Exam Triage Vital Signs ED Triage Vitals  Encounter Vitals Group     BP 02/17/23 1105 115/76     Systolic BP Percentile --      Diastolic BP Percentile --      Pulse Rate 02/17/23 1105 (!) 59     Resp 02/17/23 1105 16     Temp 02/17/23 1105 99.2 F (37.3 C)     Temp Source 02/17/23 1105 Oral     SpO2 02/17/23 1105 97 %     Weight 02/17/23 1105 157 lb (71.2 kg)     Height 02/17/23 1105 5\' 4"  (1.626 m)     Head Circumference --      Peak Flow --      Pain Score 02/17/23 1103 7     Pain Loc --      Pain Education --      Exclude from Growth Chart --    No data found.  Updated Vital Signs BP 115/76 (BP Location: Right Arm)   Pulse (!) 59   Temp 99.2 F (37.3 C) (Oral)   Resp 16   Ht 5\' 4"  (1.626 m)   Wt 157 lb (71.2 kg)   LMP 01/26/2023 (Exact Date)   SpO2 97%    Breastfeeding No   BMI 26.95 kg/m   Visual Acuity Right Eye Distance:   Left Eye Distance:   Bilateral Distance:  Right Eye Near:   Left Eye Near:    Bilateral Near:     Physical Exam Vitals reviewed.  Constitutional:      General: She is awake. She is not in acute distress.    Appearance: Normal appearance. She is well-developed. She is not ill-appearing.     Comments: Very pleasant female appear stated age in no acute distress sitting comfortably in exam room  HENT:     Head: Normocephalic and atraumatic.     Mouth/Throat:     Pharynx: Uvula midline. No oropharyngeal exudate or posterior oropharyngeal erythema.     Comments: Normal-appearing posterior oropharynx Cardiovascular:     Rate and Rhythm: Normal rate and regular rhythm.     Heart sounds: Normal heart sounds, S1 normal and S2 normal. No murmur heard. Pulmonary:     Effort: Pulmonary effort is normal.     Breath sounds: Normal breath sounds. No wheezing, rhonchi or rales.     Comments: Clear to auscultation bilaterally Skin:    Findings: Rash present. Rash is macular and papular.     Comments: Maculopapular rash with multiple fissures particularly at joints noted bilateral fingers with associated lichenification.  No bleeding, drainage noted.  Associated swelling.  Normal active range of motion.  Psychiatric:        Behavior: Behavior is cooperative.      UC Treatments / Results  Labs (all labs ordered are listed, but only abnormal results are displayed) Labs Reviewed - No data to display  EKG   Radiology No results found.  Procedures Procedures (including critical care time)  Medications Ordered in UC Medications - No data to display  Initial Impression / Assessment and Plan / UC Course  I have reviewed the triage vital signs and the nursing notes.  Pertinent labs & imaging results that were available during my care of the patient were reviewed by me and considered in my medical decision making  (see chart for details).     Patient is well-appearing, afebrile, nontoxic, nontachycardic.  No evidence of acute infection on physical exam there want initiation of antibiotics.  Given severity of symptoms we will try prednisone taper.  We discussed that she is not to take NSAIDs with this medication.  Refill of emollients and clobetasol was sent to pharmacy.  Will also use H1 and H2 blockade for additional symptom relief.  Discussed that ultimately she will need to see dermatology and was encouraged to follow-up with her PCP to ensure that referral is placed.  We discussed that if she has any worsening symptoms including swelling of her throat, shortness of breath, muffled voice she needs to be seen immediately.  Should return precautions given.  Work excuse note provided.  Final Clinical Impressions(s) / UC Diagnoses   Final diagnoses:  Dyshidrotic eczema  Swelling of both hands     Discharge Instructions      We are going to try prednisone taper given you have already been using a very high dose medication.  Take prednisone as prescribed.  Do not take NSAIDs with this medication including aspirin, ibuprofen/Advil, naproxen/Aleve.  Continue with your clobetasol and emollients.  I have sent refills to the pharmacy.  Start famotidine at night and use loratadine during the day.  It is importantly follow-up with dermatology soon as possible.  Call your primary care to ensure that they have placed the referral.  If anything worsens please return for reevaluation.     ED Prescriptions     Medication Sig  Dispense Auth. Provider   Emollient (AQUAPHOR OINTMENT BODY) AERO Use 2 times daily over hands. 175 g Dredyn Gubbels K, PA-C   clobetasol ointment (TEMOVATE) 0.05 % Apply to knuckle joints and layer under emollient lotion. 30 g Delmo Matty K, PA-C   predniSONE (STERAPRED UNI-PAK 21 TAB) 10 MG (21) TBPK tablet As directed 21 tablet Cledith Abdou K, PA-C   loratadine (CLARITIN) 10 MG tablet Take  1 tablet (10 mg total) by mouth daily. 30 tablet Aayushi Solorzano K, PA-C   famotidine (PEPCID) 40 MG tablet Take 1 tablet (40 mg total) by mouth daily. 14 tablet Jimmy Plessinger, Noberto Retort, PA-C      PDMP not reviewed this encounter.   Jeani Hawking, PA-C 02/17/23 1145

## 2023-02-17 NOTE — ED Triage Notes (Signed)
Patient here today with c/o bilat hand and facial swelling X 4 days. Her hand are also dry and cracked. Patient is allergic to peanuts and was cooking something for her husband with peanuts. She took Claritin with some relief. The swelling in her face has come down.

## 2023-02-17 NOTE — Discharge Instructions (Signed)
We are going to try prednisone taper given you have already been using a very high dose medication.  Take prednisone as prescribed.  Do not take NSAIDs with this medication including aspirin, ibuprofen/Advil, naproxen/Aleve.  Continue with your clobetasol and emollients.  I have sent refills to the pharmacy.  Start famotidine at night and use loratadine during the day.  It is importantly follow-up with dermatology soon as possible.  Call your primary care to ensure that they have placed the referral.  If anything worsens please return for reevaluation.

## 2023-02-18 ENCOUNTER — Ambulatory Visit: Payer: Medicaid Other | Admitting: Internal Medicine

## 2023-02-18 ENCOUNTER — Other Ambulatory Visit: Payer: Self-pay

## 2023-02-18 ENCOUNTER — Encounter: Payer: Self-pay | Admitting: Internal Medicine

## 2023-02-18 VITALS — BP 108/66 | HR 65 | Temp 97.9°F | Ht 64.0 in | Wt 160.9 lb

## 2023-02-18 DIAGNOSIS — L309 Dermatitis, unspecified: Secondary | ICD-10-CM | POA: Diagnosis not present

## 2023-02-18 DIAGNOSIS — J3089 Other allergic rhinitis: Secondary | ICD-10-CM

## 2023-02-18 DIAGNOSIS — J302 Other seasonal allergic rhinitis: Secondary | ICD-10-CM | POA: Diagnosis not present

## 2023-02-18 DIAGNOSIS — L2089 Other atopic dermatitis: Secondary | ICD-10-CM

## 2023-02-18 DIAGNOSIS — T781XXD Other adverse food reactions, not elsewhere classified, subsequent encounter: Secondary | ICD-10-CM

## 2023-02-18 MED ORDER — TACROLIMUS 0.1 % EX OINT: TOPICAL_OINTMENT | Freq: Two times a day (BID) | CUTANEOUS | 5 refills | Status: DC

## 2023-02-18 MED ORDER — DESONIDE 0.05 % EX CREA: TOPICAL_CREAM | CUTANEOUS | 1 refills | Status: DC

## 2023-02-18 MED ORDER — CLOBETASOL PROPIONATE 0.05 % EX OINT
TOPICAL_OINTMENT | CUTANEOUS | 3 refills | Status: DC
Start: 2023-02-18 — End: 2024-03-23

## 2023-02-18 NOTE — Progress Notes (Signed)
FOLLOW UP Date of Service/Encounter:  02/18/23   Subjective:  Melanie Peterson (DOB: 02/25/1981) is a 42 y.o. female who [facility] for follow up for eczema, allergic rhinitis and food allergies.   History obtained from: chart review and patient. Last visit was with Dr. Selena Batten on 07/17/2022 with improvement of eczema with Eucrisa and PRN clobetasol.  Avoiding shellfish and has Epipen. On rhinitis, doing well with PRN oral anti histamines.   Eczema: Still having trouble with flare ups on hands and face.  Hands are more severe.  They are dry, itchy, peeling, cracked.  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 Seen in ED yesterday for flare up, given oral prednisone which she has started.  Using Aquaphor to moisturize.  Does not use wet/dry cotton gloves at nighttime.   Food Allergies: Avoids shellfish.  Has an Epipen. No accidental exposures.  Rhinitis: Not much issues with this.  Taking OTC anti histamine like Claritin PRN.    Past Medical History: Past Medical History:  Diagnosis Date   AMA (advanced maternal age) multigravida 35+    Angio-edema    Eczema    GBS bacteriuria 02/19/2021   Needs treatment in labor   H/O postpartum hemorrhage, currently pregnant    History of macrosomia in infant in prior pregnancy, currently pregnant 02/10/2017   Ten pounds per patient.  Pushed 3 times.  No shoulder dystocia.  Nml fundal height at 36 weeks.   Indication for care in labor or delivery 06/07/2021   Nausea and vomiting after administration of anesthetic agent 02/19/2021   Patient reports severe nausea and vomiting after epidural in first pregnancy in 2009. Has not had an epidural in past 2 pregnancies but desires epidural again for this pregnancy. May require prophylactic nausea medication.    Vaginal delivery 06/09/2021    Objective:  BP 108/66   Pulse 65   Temp 97.9 F (36.6 C)   Ht 5\' 4"  (1.626  m)   Wt 160 lb 14.4 oz (73 kg)   LMP 01/26/2023 (Exact Date)   SpO2 100%   BMI 27.62 kg/m  Body mass index is 27.62 kg/m. Physical Exam: GEN: alert, well developed HEENT: clear conjunctiva,nose with mild inferior turbinate hypertrophy, pink nasal mucosa, no rhinorrhea, no cobblestoning HEART: regular rate and rhythm, no murmur LUNGS: clear to auscultation bilaterally, no coughing, unlabored respiration SKIN: thickened dry skin with cracking on bl hands mostly with first three fingers   Assessment:   1. Other atopic dermatitis   2. Seasonal and perennial allergic rhinitis   3. Other adverse food reactions, not elsewhere classified, subsequent encounter     Plan/Recommendations:  Dyshidrotic eczema - Do a daily soaking tub bath in warm water for 10-15 minutes.  - Use a gentle, unscented cleanser at the end of the bath (such as Dove unscented bar or baby wash, or Aveeno sensitive body wash). Then rinse, pat half-way dry, and apply a gentle, unscented moisturizer ointment (Vaseline, Vanicream)  all over while still damp. Dry skin makes the itching and rash of eczema worse. The skin should be moisturized with a gentle, unscented moisturizer at least twice daily.  - Use only unscented liquid laundry detergent. - Apply prescribed topical steroid (clobetasol 0.05% to hands, desonide 0.05% to face) to flared areas (red and thickened eczema) after the moisturizer has soaked into the skin (wait at least 30 minutes). Taper off the topical steroids as the  skin improves. Do not use topical steroid for more than 7-10 days at a time.  -Use Protopic 0.1% onto areas of rough eczema twice a day. May decrease to once a day as the eczema improves. This will not thin the skin, and is safe for chronic use. Do not put this onto normal appearing skin. - Will consider Dupixent.  - Recommend patch testing.   Patches are best placed on Monday with return to office on Wednesday and Friday of same week for  readings.  Patches once placed should not get wet.  Wait about 2-4 weeks after completing oral prednisone for patch testing.   - Try to wear gloves at work at all times. Discussed wet/dry cotton gloves at nighttime with moisturizer and topical steroids.   Allergic Rhinitis: - Positive skin and blood test 2023: dust mite, cockroach, trees, grasses, weeds, mouse, dogs - Avoidance measures discussed. - Use nasal saline rinses before nose sprays such as with Neilmed Sinus Rinse.  Use distilled water.   - Use Zyrtec 10 mg or Claritin 10mg  daily as needed.  - Consider allergy shots as long term control of your symptoms by teaching your immune system to be more tolerant of your allergy triggers  Food allergy:  - please strictly avoid shellfish. - Initial rxn: itching and rashes with exposure to nuts, fish, shellfish.   - SPT 05/2022: negative to nuts, fish, shellfish. sIgE 05/2022 positive to shellfish.  - for SKIN only reaction, okay to take Benadryl 25mg  capsules every 6 hours as needed - for SKIN + ANY additional symptoms, OR IF concern for LIFE THREATENING reaction = Epipen Autoinjector EpiPen 0.3 mg. - If using Epinephrine autoinjector, call 911 or go to the ER.    Follow up for patch testing when available about 2-4 weeks after completing oral prednisone .  Follow up in 3 months.     No follow-ups on file.  Alesia Morin, MD [facility]

## 2023-02-18 NOTE — Patient Instructions (Signed)
Dyshidrotic eczema - Do a daily soaking tub bath in warm water for 10-15 minutes.  - Use a gentle, unscented cleanser at the end of the bath (such as Dove unscented bar or baby wash, or Aveeno sensitive body wash). Then rinse, pat half-way dry, and apply a gentle, unscented moisturizer ointment (Vaseline, Vanicream)  all over while still damp. Dry skin makes the itching and rash of eczema worse. The skin should be moisturized with a gentle, unscented moisturizer at least twice daily.  - Use only unscented liquid laundry detergent. - Apply prescribed topical steroid (clobetasol 0.05% to hands) to flared areas (red and thickened eczema) after the moisturizer has soaked into the skin (wait at least 30 minutes). Taper off the topical steroids as the skin improves. Do not use topical steroid for more than 7-10 days at a time.  -Use Protopic 0.1% onto areas of rough eczema twice a day. May decrease to once a day as the eczema improves. This will not thin the skin, and is safe for chronic use. Do not put this onto normal appearing skin. - Recommend patch testing.   Patches are best placed on Monday with return to office on Wednesday and Friday of same week for readings.  Patches once placed should not get wet.  Wait about 2-4 weeks after completing oral prednisone for patch testing.   - Try to wear gloves at work at all times.   Allergic Rhinitis: - Positive skin and blood test 2023: dust mite, cockroach, trees, grasses, weeds, mouse, dogs - Avoidance measures discussed. - Use nasal saline rinses before nose sprays such as with Neilmed Sinus Rinse.  Use distilled water.   - Use Zyrtec 10 mg daily as needed.  - Consider allergy shots as long term control of your symptoms by teaching your immune system to be more tolerant of your allergy triggers  Food allergy:  - please strictly avoid shellfish. - Initial rxn: itching and rashes with exposure to nuts, fish, shellfish.   - SPT 05/2022: negative to nuts,  fish, shellfish. sIgE 05/2022 positive to shellfish.  - for SKIN only reaction, okay to take Benadryl 25mg  capsules every 6 hours as needed - for SKIN + ANY additional symptoms, OR IF concern for LIFE THREATENING reaction = Epipen Autoinjector EpiPen 0.3 mg. - If using Epinephrine autoinjector, call 911 or go to the ER.    Follow up for patch testing when available.  Follow up in 3 months.

## 2023-03-24 ENCOUNTER — Other Ambulatory Visit: Payer: Self-pay

## 2023-03-24 ENCOUNTER — Ambulatory Visit (INDEPENDENT_AMBULATORY_CARE_PROVIDER_SITE_OTHER): Payer: Medicaid Other | Admitting: Family Medicine

## 2023-03-24 ENCOUNTER — Encounter: Payer: Self-pay | Admitting: Family Medicine

## 2023-03-24 VITALS — BP 110/70 | HR 63 | Temp 97.9°F | Resp 12 | Wt 164.0 lb

## 2023-03-24 DIAGNOSIS — L235 Allergic contact dermatitis due to other chemical products: Secondary | ICD-10-CM

## 2023-03-24 DIAGNOSIS — L259 Unspecified contact dermatitis, unspecified cause: Secondary | ICD-10-CM | POA: Insufficient documentation

## 2023-03-24 NOTE — Progress Notes (Signed)
Follow-up Note  RE: Melanie Peterson MRN: [id number] DOB: 11/14/1980 Date of Office Visit: 03/24/2023  Primary care provider: Rudene Christians, DO Referring provider: Rudene Christians, DO   Melanie Peterson returns to the office today for the patch test placement, given suspected history of contact dermatitis. She reports that she continues to experience some dermatitis on bilateral hands. 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 She has not had any steroids over the last 4 weeks. Her current medications are listed in the chart.   Diagnostics: True Test patches placed.   Plan:   Allergic contact dermatitis - Instructions provided on care of the patches for the next 48 hours. Melanie Peterson was instructed to avoid showering for the next 48 hours. Melanie Peterson will follow up in 48 hours and 96 hours for patch readings.    Call the clinic if this treatment plan is not working well for you  Follow up in 2 days or sooner if needed.

## 2023-03-24 NOTE — Patient Instructions (Signed)
Diagnostics: True Test patches placed.   Plan:   Allergic contact dermatitis - Instructions provided on care of the patches for the next 48 hours. Melanie Peterson was instructed to avoid showering for the next 48 hours. [name] will follow up in 48 hours and 96 hours for patch readings.    Call the clinic if this treatment plan is not working well for you  Follow up in 2 days or sooner if needed.

## 2023-03-25 ENCOUNTER — Ambulatory Visit: Payer: Medicaid Other | Admitting: Family

## 2023-03-26 ENCOUNTER — Ambulatory Visit (INDEPENDENT_AMBULATORY_CARE_PROVIDER_SITE_OTHER): Payer: Medicaid Other | Admitting: Internal Medicine

## 2023-03-26 ENCOUNTER — Encounter: Payer: Self-pay | Admitting: Internal Medicine

## 2023-03-26 DIAGNOSIS — L235 Allergic contact dermatitis due to other chemical products: Secondary | ICD-10-CM | POA: Diagnosis not present

## 2023-03-26 NOTE — Progress Notes (Signed)
   Follow Up Note  RE: Lenesha Waxman MRN: [id number] DOB: 07/01/1981 Date of Office Visit: 03/26/2023  Referring provider: Rudene Christians, DO Primary care provider: Rudene Christians, DO  History of Present Illness: I had the pleasure of seeing [facility] for a follow up visit at the [facility] on 03/26/2023. She is a 42 y.o. female, who is being followed for contact dermatitis. Today she is here for initial patch test interpretation, given suspected history of contact dermatitis.   Diagnostics:  TRUE TEST 48 hour reading:    Londa returns to the office today for the final patch test interpretation, given suspected history of contact dermatitis.    Diagnostics:   TRUE TEST 48-hour hour reading:  2+ reaction to #1 (Nickel Sulfate) and 2+ reaction to #20 (p-Phenylene-diamine)   Assessment and Plan: Chalia is a 42 y.o. female with: Concern for Contact Dermatitis: She works as a Producer, television/film/video.  With positive PPD, we discussed not using any hair products without gloves on.   The patient has been provided detailed information regarding the substances she is sensitive to, as well as products containing the substances.  Meticulous avoidance of these substances is recommended. If avoidance is not possible, the use of barrier creams or lotions is recommended. If symptoms persist or progress despite meticulous avoidance of chemicals/substances above, dermatology evaluation may be warranted.  Follow up on Friday for final read.  It was my pleasure to see Bonni today and participate in her care. Please feel free to contact me with any questions or concerns.  Sincerely,   Alesia Morin, MD [facility]

## 2023-03-28 ENCOUNTER — Ambulatory Visit (INDEPENDENT_AMBULATORY_CARE_PROVIDER_SITE_OTHER): Payer: Medicaid Other | Admitting: Internal Medicine

## 2023-03-28 DIAGNOSIS — L234 Allergic contact dermatitis due to dyes: Secondary | ICD-10-CM | POA: Diagnosis not present

## 2023-03-28 DIAGNOSIS — L230 Allergic contact dermatitis due to metals: Secondary | ICD-10-CM

## 2023-03-28 NOTE — Patient Instructions (Addendum)
Contact Dermatitis: -Patch test 03/2023: positive reactions to nickel and PPD.  Recommend strict avoidance. Avoid exposure to any hair products at work; wear gloves when handling them. -Will send safe product list to email also.     The patient has been provided detailed information regarding the substances she is sensitive to, as well as products containing the substances.  Meticulous avoidance of these substances is recommended. If avoidance is not possible, the use of barrier creams or lotions is recommended. If symptoms persist or progress despite meticulous avoidance of chemicals/substances above, dermatology evaluation may be warranted.    Dyshidrotic eczema - Do a daily soaking tub bath in warm water for 10-15 minutes.  - Use a gentle, unscented cleanser at the end of the bath (such as Dove unscented bar or baby wash, or Aveeno sensitive body wash). Then rinse, pat half-way dry, and apply a gentle, unscented moisturizer ointment (Vaseline, Vanicream)  all over while still damp. Dry skin makes the itching and rash of eczema worse. The skin should be moisturized with a gentle, unscented moisturizer at least twice daily.  - Use only unscented liquid laundry detergent. - Apply prescribed topical steroid (clobetasol 0.05% to hands, desonide 0.05% to face) to flared areas (red and thickened eczema) after the moisturizer has soaked into the skin (wait at least 30 minutes). Taper off the topical steroids as the skin improves. Do not use topical steroid for more than 7-10 days at a time.  -Use Protopic 0.1% onto areas of rough eczema twice a day. May decrease to once a day as the eczema improves. This will not thin the skin, and is safe for chronic use. Do not put this onto normal appearing skin. - Will consider Dupixent.

## 2023-03-28 NOTE — Progress Notes (Signed)
Follow Up Note  RE: Mithra Branden MRN: [id number] DOB: 1980/09/27 Date of Office Visit: 03/28/2023  Referring provider: Rudene Christians, DO Primary care provider: Rudene Christians, DO  History of Present Illness: I had the pleasure of seeing [facility] for a follow up visit at the [facility] on 03/28/2023. She is a 42 y.o. female, who is being followed for contact dermatitis. Today she is here for final patch test interpretation, given suspected history of contact dermatitis.   Diagnostics:  TRUE TEST 96 hour reading:    Shonna returns to the office today for the final patch test interpretation, given suspected history of contact dermatitis.    Diagnostics:   TRUE TEST 96-hour hour reading:  2+ reaction to #1 (Nickel Sulfate) and 2+ reaction to #20 (p-Phenylene-diamine)  Assessment and Plan: Rafeef is a 42 y.o. female with: Contact Dermatitis: -Patch test 03/2023: positive reactions to nickel and PPD.  Recommend strict avoidance. Avoid exposure to any hair products at work; wear gloves when handling them. -Will send safe product list to email also.     The patient has been provided detailed information regarding the substances she is sensitive to, as well as products containing the substances.  Meticulous avoidance of these substances is recommended. If avoidance is not possible, the use of barrier creams or lotions is recommended. If symptoms persist or progress despite meticulous avoidance of chemicals/substances above, dermatology evaluation may be warranted.    Dyshidrotic eczema - Do a daily soaking tub bath in warm water for 10-15 minutes.  - Use a gentle, unscented cleanser at the end of the bath (such as Dove unscented bar or baby wash, or Aveeno sensitive body wash). Then rinse, pat half-way dry, and apply a gentle, unscented moisturizer ointment (Vaseline, Vanicream)  all over while still damp. Dry skin makes the itching and rash of eczema worse. The  skin should be moisturized with a gentle, unscented moisturizer at least twice daily.  - Use only unscented liquid laundry detergent. - Apply prescribed topical steroid (clobetasol 0.05% to hands, desonide 0.05% to face) to flared areas (red and thickened eczema) after the moisturizer has soaked into the skin (wait at least 30 minutes). Taper off the topical steroids as the skin improves. Do not use topical steroid for more than 7-10 days at a time.  -Use Protopic 0.1% onto areas of rough eczema twice a day. May decrease to once a day as the eczema improves. This will not thin the skin, and is safe for chronic use. Do not put this onto normal appearing skin. - Will consider Dupixent.  Return in about 3 months (around 06/27/2023).  It was my pleasure to see Azaliah today and participate in her care. Please feel free to contact me with any questions or concerns.  Sincerely,   Alesia Morin, MD [facility]

## 2023-04-19 ENCOUNTER — Ambulatory Visit (HOSPITAL_COMMUNITY)
Admission: EM | Admit: 2023-04-19 | Discharge: 2023-04-19 | Disposition: A | Discharge status: Home / Self Care | Payer: Medicaid Other | Attending: Emergency Medicine | Admitting: Emergency Medicine

## 2023-04-19 ENCOUNTER — Encounter (HOSPITAL_COMMUNITY): Payer: Self-pay

## 2023-04-19 DIAGNOSIS — L02412 Cutaneous abscess of left axilla: Secondary | ICD-10-CM

## 2023-04-19 MED ORDER — IBUPROFEN 800 MG PO TABS
800.0000 mg | ORAL_TABLET | Freq: Once | ORAL | Status: AC
  Administered 2023-04-19: 800 mg via ORAL

## 2023-04-19 MED ORDER — DOXYCYCLINE HYCLATE 100 MG PO CAPS: 100.0000 mg | ORAL_CAPSULE | Freq: Two times a day (BID) | ORAL | 0 refills | Status: AC

## 2023-04-19 MED ORDER — IBUPROFEN 800 MG PO TABS: 800.0000 mg | ORAL_TABLET | Freq: Three times a day (TID) | ORAL | 0 refills | Status: AC

## 2023-04-19 MED ORDER — IBUPROFEN 800 MG PO TABS
ORAL_TABLET | ORAL | Status: AC
  Filled 2023-04-19: qty 1

## 2023-04-19 NOTE — ED Triage Notes (Signed)
Patient reports that she has an abscess to the left axilla.

## 2023-04-19 NOTE — Discharge Instructions (Addendum)
Please take medication as prescribed. Take with food to avoid upset stomach. Continue ibuprofen and tylenol, alternated every 4-6 hours Continue warm compress to promote drainage Please return if needed  I also recommended to contact the general surgery clinic.  You may need to have this surgically removed if it is recurrent

## 2023-04-19 NOTE — ED Provider Notes (Signed)
[facility]    CSN: [id number] Arrival date & time: 04/19/23  1540      History   Chief Complaint Chief Complaint  Patient presents with   Abscess    HPI Matteson Blue is a 42 y.o. female.  Here with abscess in the left axilla for 3-4 days Last night it started draining She reports recurrent abscess in this area after she shaves  Has used tylenol and warm compress that are helpful No fevers  Past Medical History:  Diagnosis Date   AMA (advanced maternal age) multigravida 35+    Angio-edema    Eczema    GBS bacteriuria 02/19/2021   Needs treatment in labor   H/O postpartum hemorrhage, currently pregnant    History of macrosomia in infant in prior pregnancy, currently pregnant 02/10/2017   Ten pounds per patient.  Pushed 3 times.  No shoulder dystocia.  Nml fundal height at 36 weeks.   Indication for care in labor or delivery 06/07/2021   Nausea and vomiting after administration of anesthetic agent 02/19/2021   Patient reports severe nausea and vomiting after epidural in first pregnancy in 2009. Has not had an epidural in past 2 pregnancies but desires epidural again for this pregnancy. May require prophylactic nausea medication.    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   Abscess of right axilla 06/03/2022   Dyshidrotic eczema 05/15/2022   Seasonal and perennial allergic rhinitis 05/15/2022   Food allergy 05/15/2022   Shortness of breath 05/15/2022   Atopic dermatitis 01/22/2022   Tonsillolith 01/22/2022   Globus sensation 01/22/2022   History of thrombocytopenia    Sickle cell trait (HCC) 02/10/2017   History of anesthesia complications 02/10/2017    Past Surgical History:  Procedure Laterality Date   NO PAST SURGERIES      OB History     Gravida  6   Para  5   Term  5   Preterm  0   AB  1   Living  5      SAB  1    IAB  0   Ectopic  0   Multiple  0   Live Births  5            Home Medications    Prior to Admission medications   Medication Sig Start Date End Date Taking? Authorizing Provider  doxycycline (VIBRAMYCIN) 100 MG capsule Take 1 capsule (100 mg total) by mouth 2 (two) times daily for 7 days. 04/19/23 04/26/23 Yes Keena Dinse, Lurena Joiner, PA-C  ibuprofen (ADVIL) 800 MG tablet Take 1 tablet (800 mg total) by mouth 3 (three) times daily. 04/19/23  Yes Eveleen Mcnear, Lurena Joiner, PA-C  Blood Pressure Monitoring (BLOOD PRESSURE KIT) DEVI 1 Device by Does not apply route as needed. 11/21/20   Hermina Staggers, MD  clobetasol ointment (TEMOVATE) 0.05 % Apply for eczema flare ups to hands. Do not use on face. 02/18/23   Birder Robson, MD  Crisaborole (EUCRISA) 2 % OINT Apply 1 Application topically 2 (two) times daily as needed (mild rash). 05/15/22   Ellamae Sia, DO  Dermatological Products, Misc. [facility]) lotion Apply twice a day to the body as a moisturizer 05/15/22   Ellamae Sia, DO  desonide (DESOWEN) 0.05 % cream Apply for eczema flare ups to face. Maximum use 7 days. 02/18/23   Birder Robson, MD  Emollient (  AQUAPHOR OINTMENT BODY) AERO Use 2 times daily over hands. 02/17/23   Raspet, Noberto Retort, PA-C  EPINEPHrine 0.3 mg/0.3 mL IJ SOAJ injection Inject 0.3 mg into the muscle as needed for anaphylaxis. 05/15/22   Ellamae Sia, DO  loratadine (CLARITIN) 10 MG tablet Take 1 tablet (10 mg total) by mouth daily. 02/17/23   Raspet, Noberto Retort, PA-C  predniSONE (STERAPRED UNI-PAK 21 TAB) 10 MG (21) TBPK tablet As directed 02/17/23   Raspet, Noberto Retort, PA-C    Family History Family History  Problem Relation Age of Onset   Diabetes Mother    Hypertension Mother    Hypertension Maternal Grandmother    Heart disease Maternal Grandmother    Allergic rhinitis Neg Hx    Asthma Neg Hx    Eczema Neg Hx    Urticaria Neg Hx     Social History Social History   Tobacco Use   Smoking status: Never   Smokeless tobacco: Never  Vaping  Use   Vaping status: Never Used  Substance Use Topics   Alcohol use: No   Drug use: No     Allergies   Fish allergy, Peanut butter flavor, and Shrimp (diagnostic)   Review of Systems Review of Systems Per HPI  Physical Exam Triage Vital Signs ED Triage Vitals  Encounter Vitals Group     BP 04/19/23 1726 123/79     Systolic BP Percentile --      Diastolic BP Percentile --      Pulse Rate 04/19/23 1726 60     Resp 04/19/23 1726 14     Temp 04/19/23 1726 98.7 F (37.1 C)     Temp Source 04/19/23 1726 Oral     SpO2 04/19/23 1726 97 %     Weight --      Height --      Head Circumference --      Peak Flow --      Pain Score 04/19/23 1728 6     Pain Loc --      Pain Education --      Exclude from Growth Chart --    No data found.  Updated Vital Signs BP 123/79 (BP Location: Right Arm)   Pulse 60   Temp 98.7 F (37.1 C) (Oral)   Resp 14   LMP 04/07/2023   SpO2 97%   Physical Exam Vitals and nursing note reviewed.  Constitutional:      General: She is not in acute distress.    Appearance: She is not ill-appearing.  HENT:     Mouth/Throat:     Mouth: Mucous membranes are moist.     Pharynx: Oropharynx is clear.  Eyes:     Conjunctiva/sclera: Conjunctivae normal.  Cardiovascular:     Rate and Rhythm: Normal rate and regular rhythm.     Heart sounds: Normal heart sounds.  Pulmonary:     Effort: Pulmonary effort is normal.     Breath sounds: Normal breath sounds.  Musculoskeletal:     Cervical back: Normal range of motion.  Skin:    Findings: Abscess present.     Comments: Fluctuant golf ball sized mass in left axilla, small area of drainage. Very tender, erythematous. Patient does not tolerate even light palpation of surrounding area  Neurological:     Mental Status: She is alert and oriented to person, place, and time.      UC Treatments / Results  Labs (all labs ordered are listed, but only abnormal results are  displayed) Labs Reviewed - No data  to display  EKG   Radiology No results found.  Procedures Procedures  Medications Ordered in UC Medications  ibuprofen (ADVIL) tablet 800 mg (800 mg Oral Given 04/19/23 1800)    Initial Impression / Assessment and Plan / UC Course  I have reviewed the triage vital signs and the nursing notes.  Pertinent labs & imaging results that were available during my care of the patient were reviewed by me and considered in my medical decision making (see chart for details).  Ibuprofen dose given for pain Large abscess in the left axilla. Draining since last night. Advised continue warm compress to promote further drainage. Patient would not tolerate any I&D in clinic today. Advised antibiotic, doxy BID x 7 days and alternate tylenol and ibuprofen. Can return for drainage if needed. Also advised follow up with general surgery given recurrence in this area. Patient is agreeable to plan, no questions at this time  Final Clinical Impressions(s) / UC Diagnoses   Final diagnoses:  Abscess of left axilla     Discharge Instructions      Please take medication as prescribed. Take with food to avoid upset stomach. Continue ibuprofen and tylenol, alternated every 4-6 hours Continue warm compress to promote drainage Please return if needed  I also recommended to contact the general surgery clinic.  You may need to have this surgically removed if it is recurrent    ED Prescriptions     Medication Sig Dispense Auth. Provider   doxycycline (VIBRAMYCIN) 100 MG capsule Take 1 capsule (100 mg total) by mouth 2 (two) times daily for 7 days. 14 capsule Gumecindo Hopkin, PA-C   ibuprofen (ADVIL) 800 MG tablet Take 1 tablet (800 mg total) by mouth 3 (three) times daily. 21 tablet Christyn Gutkowski, Lurena Joiner, PA-C      PDMP not reviewed this encounter.   Dorean Hiebert, Lurena Joiner, [place] 04/20/23 1032

## 2023-06-11 ENCOUNTER — Ambulatory Visit: Payer: Medicaid Other | Admitting: Dermatology

## 2023-06-17 ENCOUNTER — Ambulatory Visit: Payer: Medicaid Other | Admitting: Dermatology

## 2023-06-17 ENCOUNTER — Encounter: Payer: Self-pay | Admitting: Dermatology

## 2023-06-17 VITALS — BP 122/81 | HR 76

## 2023-06-17 DIAGNOSIS — L309 Dermatitis, unspecified: Secondary | ICD-10-CM

## 2023-06-17 DIAGNOSIS — L2089 Other atopic dermatitis: Secondary | ICD-10-CM

## 2023-06-17 MED ORDER — BETAMETHASONE DIPROPIONATE 0.05 % EX CREA: TOPICAL_CREAM | Freq: Two times a day (BID) | CUTANEOUS | 2 refills | Status: AC

## 2023-06-17 NOTE — Patient Instructions (Addendum)
Hello [name],  Thank you for visiting my office today. Your dedication to managing your hand eczema is commendable, and I'm pleased we could outline effective strategies to enhance your condition.  Here is a summary of the key instructions from today's consultation:  Diagnosis: Hand Eczema  - Medication: Transition to Betamethasone Dipropionate 0.05%. This should be applied both in the morning and at night.   - Application: Apply every morning and night.   - Duration: Continue this routine for two weeks, followed by a two-week break. This cycle is important to prevent skin pigment loss.  - Skin Care: It's crucial to protect your skin during daily activities.   - Protection: Always wear gloves when engaging in activities like washing dishes or using cleaning products.   - Moisturizing: Apply moisturizer frequently, especially after washing your hands. We have provided samples of Neutrogena hand cream for you to start with.  - Prescription Details: Your prescription for Betamethasone has been processed and sent to your pharmacy, complete with the necessary refills.  - Future Considerations: Should your eczema persist, we may explore additional treatment options, such as Dupixent or Ebglyss, which involve bi-weekly injections.  Please adhere to these instructions closely and monitor your skin's response. We are eager to reassess your condition in the new year. Wishing you a joyful holiday season!  Warm regards,  Dr. Langston Reusing,  Dermatology   Important Information  Due to recent changes in healthcare laws, you may see results of your pathology and/or laboratory studies on MyChart before the doctors have had a chance to review them. We understand that in some cases there may be results that are confusing or concerning to you. Please understand that not all results are received at the same time and often the doctors may need to interpret multiple results in order to provide you with  the best plan of care or course of treatment. Therefore, we ask that you please give [place] 2 business days to thoroughly review all your results before contacting the office for clarification. Should we see a critical lab result, you will be contacted sooner.   If You Need Anything After Your Visit  If you have any questions or concerns for your doctor, please call our main line at [phone] If no one answers, please leave a voicemail as directed and we will return your call as soon as possible. Messages left after 4 pm will be answered the following business day.   You may also send [place] a message via MyChart. We typically respond to MyChart messages within 1-2 business days.  For prescription refills, please ask your pharmacy to contact our office. Our fax number is [phone].  If you have an urgent issue when the clinic is closed that cannot wait until the next business day, you can page your doctor at the number below.    Please note that while we do our best to be available for urgent issues outside of office hours, we are not available 24/7.   If you have an urgent issue and are unable to reach [place], you may choose to seek medical care at your doctor's office, retail clinic, urgent care center, or emergency room.  If you have a medical emergency, please immediately call 911 or go to the emergency department. In the event of inclement weather, please call our main line at [phone] for an update on the status of any delays or closures.  Dermatology Medication Tips: Please keep the boxes that topical medications come  in in order to help keep track of the instructions about where and how to use these. Pharmacies typically print the medication instructions only on the boxes and not directly on the medication tubes.   If your medication is too expensive, please contact our office at [phone] or send [place] a message through MyChart.   We are unable to tell what your co-pay for medications  will be in advance as this is different depending on your insurance coverage. However, we may be able to find a substitute medication at lower cost or fill out paperwork to get insurance to cover a needed medication.   If a prior authorization is required to get your medication covered by your insurance company, please allow [place] 1-2 business days to complete this process.  Drug prices often vary depending on where the prescription is filled and some pharmacies may offer cheaper prices.  The website www.goodrx.com contains coupons for medications through different pharmacies. The prices here do not account for what the cost may be with help from insurance (it may be cheaper with your insurance), but the website can give you the price if you did not use any insurance.  - You can print the associated coupon and take it with your prescription to the pharmacy.  - You may also stop by our office during regular business hours and pick up a GoodRx coupon card.  - If you need your prescription sent electronically to a different pharmacy, notify our office through [facility] or by phone at [phone]

## 2023-06-17 NOTE — Progress Notes (Addendum)
   New Patient Visit   Subjective  Melanie Peterson is a 42 y.o. female who presents for the following: New Pt - dermatitis  Patient states she has dermatitis located at the hands that she would like to have examined. Patient reports the areas have been there for 15 years since 2009. She reports the areas are bothersome.Patient rates irritation (itchy) 10 out of 10. She states that the areas have not spread. Patient reports she has previously been treated for these areas by PCP. She was Rx Clobetasol  0.05% oint & Desonide  0.05% cream. Pt stated that at times they help but it reoccurs frequently. Patient denies Hx of bx. Patient denies family history of skin cancer(s).  The patient has spots, moles and lesions to be evaluated, some may be new or changing and the patient may have concern these could be cancer.   The following portions of the chart were reviewed this encounter and updated as appropriate: medications, allergies, medical history  Review of Systems:  No other skin or systemic complaints except as noted in HPI or Assessment and Plan.  Objective  Well appearing patient in no apparent distress; mood and affect are within normal limits.   A focused examination was performed of the following areas:   Relevant exam findings are noted in the Assessment and Plan.          Assessment & Plan  Ms. [facility] presented with recurrent hand eczema, which worsens in winter and is partially responsive to clobetasol  cream. She has been using clobetasol  inconsistently and finds desonide  cream ineffective. The plan includes switching to betamethasone  dipropionate 0.05%, applying it twice daily for two weeks, and taking a two-week break. She was advised to wear gloves when handling cleaning products, moisturize after hand washing, and was provided with Neutrogena hand cream samples. A follow-up appointment is scheduled for the new year.    Hand Eczema - Assessment: Recurrent hand eczema,  partially responsive to clobetasol  but continues to relapse. Symptoms worsen during winter. Inconsistent use of clobetasol , with the last application three days ago. Triggers identified include dishwashing and exposure to cleaning products. - Plan:   - Switch to betamethasone  dipropionate 0.05%.   - Apply betamethasone  every morning and night for two weeks, followed by a two-week break.   - Wear gloves when washing dishes and handling cleaning products.   - Moisturize hands after each hand washing. Samples of Neutrogena hand cream provided.    - Discontinue use of desonide  cream for hands.   - Consider Dupixent  or Ebglyss injections if eczema persists despite current measures.   - Follow-up appointment scheduled for the new year.   No follow-ups on file.    Documentation: I have reviewed the above documentation for accuracy and completeness, and I agree with the above.   I, Shirron Maranda, CMA, am acting as scribe for [employer], DO.    Delon Lenis, DO

## 2023-09-15 ENCOUNTER — Ambulatory Visit (INDEPENDENT_AMBULATORY_CARE_PROVIDER_SITE_OTHER): Payer: Medicaid Other | Admitting: Dermatology

## 2023-09-15 ENCOUNTER — Encounter: Payer: Self-pay | Admitting: Dermatology

## 2023-09-15 VITALS — BP 123/80 | HR 57

## 2023-09-15 DIAGNOSIS — L2089 Other atopic dermatitis: Secondary | ICD-10-CM

## 2023-09-15 DIAGNOSIS — L309 Dermatitis, unspecified: Secondary | ICD-10-CM | POA: Diagnosis not present

## 2023-09-15 DIAGNOSIS — Z7189 Other specified counseling: Secondary | ICD-10-CM | POA: Diagnosis not present

## 2023-09-15 MED ORDER — DUPIXENT 300 MG/2ML ~~LOC~~ SOAJ
600.0000 mg | Freq: Once | SUBCUTANEOUS | 0 refills | Status: AC
Start: 2023-09-15 — End: 2023-09-15

## 2023-09-15 MED ORDER — DUPIXENT 300 MG/2ML ~~LOC~~ SOAJ
300.0000 mg | SUBCUTANEOUS | 11 refills | Status: DC
Start: 2023-09-15 — End: 2024-03-23

## 2023-09-15 NOTE — Patient Instructions (Addendum)
 Hello [name],  Thank you for visiting [place] today.   Below is a summary of the key instructions from today's consultation:  Dupixent Treatment: We are starting you on Dupixent, an injectable medication designed to control inflammation without suppressing the immune system.   Administration: Dupixent is administered via injection every 2 weeks.   Procedure: Today, we will process the necessary insurance approval forms. Upon approval, we will arrange a session for injection training.   Follow-Up: Should you not receive an approval notification within 3 weeks, please reach out to our office.   Delivery: [facility] will handle the delivery of your medication. They will confirm insurance details with you and schedule the delivery of a monthly box containing 2 syringes.  Current Medication: Please continue using betamethasone as previously directed, in conjunction with the new Dupixent treatment.  Skin Care Recommendations:   Face Care: Persist with your current night cream regimen. Monitor its effectiveness carefully and avoid products with perfume to prevent eczema flare-ups.   Hand Care: Apply Vaseline to your hands at night to enhance moisture retention.  Potential Side Effects: While Dupixent is associated with minimal side effects, be aware that keratitis is a rare but treatable condition with eye drops.  We are here to support you through this treatment journey. Do not hesitate to contact us with any questions or concerns you may have. We are optimistic about the positive outcomes of your new treatment plan and look forward to your next visit.  Warm regards,  Dr. [facility]    Important Information   Due to recent changes in healthcare laws, you may see results of your pathology and/or laboratory studies on MyChart before the doctors have had a chance to review them. We understand that in some cases there may be results that are confusing or concerning to you.  Please understand that not all results are received at the same time and often the doctors may need to interpret multiple results in order to provide you with the best plan of care or course of treatment. Therefore, we ask that you please give [place] 2 business days to thoroughly review all your results before contacting the office for clarification. Should we see a critical lab result, you will be contacted sooner.     If You Need Anything After Your Visit   If you have any questions or concerns for your doctor, please call our main line at [phone]. If no one answers, please leave a voicemail as directed and we will return your call as soon as possible. Messages left after 4 pm will be answered the following business day.    You may also send [place] a message via MyChart. We typically respond to MyChart messages within 1-2 business days.  For prescription refills, please ask your pharmacy to contact our office. Our fax number is [phone].  If you have an urgent issue when the clinic is closed that cannot wait until the next business day, you can page your doctor at the number below.     Please note that while we do our best to be available for urgent issues outside of office hours, we are not available 24/7.    If you have an urgent issue and are unable to reach [place], you may choose to seek medical care at your doctor's office, retail clinic, urgent care center, or emergency room.   If you have a medical emergency, please immediately call 911 or go to the emergency department. In the  event of inclement weather, please call our main line at [phone] for an update on the status of any delays or closures.  Dermatology Medication Tips: Please keep the boxes that topical medications come in in order to help keep track of the instructions about where and how to use these. Pharmacies typically print the medication instructions only on the boxes and not directly on the medication tubes.   If your  medication is too expensive, please contact our office at [phone] or send [place] a message through MyChart.    We are unable to tell what your co-pay for medications will be in advance as this is different depending on your insurance coverage. However, we may be able to find a substitute medication at lower cost or fill out paperwork to get insurance to cover a needed medication.    If a prior authorization is required to get your medication covered by your insurance company, please allow [place] 1-2 business days to complete this process.   Drug prices often vary depending on where the prescription is filled and some pharmacies may offer cheaper prices.   The website www.goodrx.com contains coupons for medications through different pharmacies. The prices here do not account for what the cost may be with help from insurance (it may be cheaper with your insurance), but the website can give you the price if you did not use any insurance.  - You can print the associated coupon and take it with your prescription to the pharmacy.  - You may also stop by our office during regular business hours and pick up a GoodRx coupon card.  - If you need your prescription sent electronically to a different pharmacy, notify our office through [facility] or by phone at [phone]

## 2023-09-15 NOTE — Progress Notes (Signed)
 Follow-Up Visit   Subjective  Melanie Peterson is a 43 y.o. female who presents for the following: Atopic Derm  Patient present today for follow up visit for Atopic Derm. Patient was last evaluated on 06/16/24. At this visit patient was prescribed betamethasone dipropionate 0.05%. She rates her itch 5 out of 10. Patient is opened to discuss Dupixent injection. Patient reports sxs are  waxing and wanning . Patient denies medication changes.  The following portions of the chart were reviewed this encounter and updated as appropriate: medications, allergies, medical history  Review of Systems:  No other skin or systemic complaints except as noted in HPI or Assessment and Plan.  Objective  Well appearing patient in no apparent distress; mood and affect are within normal limits.  A focused examination was performed of the following areas: Hands  Relevant exam findings are noted in the Assessment and Plan.           Assessment & Plan   HAND DERMATITIS Exam: Scaly pink plaques +/- fissures 4% BSA,  IGA 3  Flared  Pt education discussed: Hand Dermatitis is a chronic type of eczema that can come and go on the hands and fingers.  While there is no cure, the rash and symptoms can be managed with topical prescription medications, and for more severe cases, with systemic medications.  Recommend mild soap and routine use of moisturizing cream after handwashing.  Minimize soap/water exposure when possible.    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 Previous treatments with tacrolimus and betamethasone have provided temporary relief, but the condition persists. 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  Treatment Plan: - We will plan to prescribe Dupixent, forms completed while in office today - Recommended continuing  Betamethasone until approved for Dupixent  Plan: Dupixent Initiation Indications:  Patient isn't a candidate for systemic therapy with methotrexate or cyclosporine. Patient has been unresponsive to aggressive topical therapy.  Failed Treatments: Topical Steroids and Topical Protopic  Treatment Protocol: 600 mg [place] day 0 then 300 mg [place] every other week  Specific Contraindications Cyclosporine is contraindicated because the patient will not be able to complete the necessary follow-up labs. Methotrexate is contraindicated because the patient will not be able to complete the necessary follow-up labs. Phototherapy is contraindicated because the patient lives too far from the treatment location.   Due to the progressive and chronic nature of her Hand Dermatitis, patient has tried and failed numerous topicals creams as noted above, the next best therapeutic option is a systemic therapy. It is medically necessary to help improve her quality of life.   Dupixent Counseling: I discussed with the patient the risks of dupilumab including but not limited to eye infection and irritation, cold sores, injection site reactions, worsening of asthma, allergic reactions and increased risk of parasitic infection. Live vaccines should be avoided while taking dupilumab. Dupilumab will also interact with certain medications such as warfarin and cyclosporine. The patient understands that monitoring is required and they must alert [place] or the primary physician if symptoms of infection or other concerning signs are noted.  Dupixent Monitoring: There is no laboratory monitoring requirement with Dupixent.     No follow-ups on file.  Documentation: I have reviewed the above documentation for accuracy and completeness, and I agree with the above.  Stasia Cavalier, am acting as scribe for [employer], DO.  Victorino Dike  Onalee Hua, DO

## 2023-10-30 MED ORDER — TACROLIMUS 0.1 % EX OINT: TOPICAL_OINTMENT | Freq: Two times a day (BID) | CUTANEOUS | 0 refills | Status: AC

## 2023-10-30 NOTE — Addendum Note (Signed)
 Addended by: Georgeann Brinkman U on: 10/30/2023 03:55 PM   Modules accepted: Orders

## 2024-01-04 NOTE — Progress Notes (Deleted)
   [address] [place] [address] Dept: [phone]  FOLLOW UP NOTE  Patient ID: Melanie Peterson, female    DOB: [birth year]  Age: 43 y.o. MRN: [id number] Date of Office Visit: 01/05/2024  Assessment  Chief Complaint: No chief complaint on file.  HPI [name] is a 43 year old female who presents to the clinic for follow-up visit.  She was last seen in this clinic on 03/28/2023 for final true Test chemical patch reading which was  positive reactions to nickel and PPD.  Prior to that visit she was seen in this clinic on 02/18/2019 for by Dr. Tobie for evaluation of allergic rhinitis, atopic dermatitis, and shellfish allergy .  Her last environmental allergy  skin testing was on 05/16/2022 and was positive to dust mite.  Her last environmental allergy  testing by lab on 05/15/2022 was positive to dust mite, grass pollen, and weed pollen.  Her last food allergy  testing via lab on 05/15/2022 was positive to shellfish and borderline to peanut  and macadamia nut.  Discussed the use of AI scribe software for clinical note transcription with the patient, who gave verbal consent to proceed.  History of Present Illness      Drug Allergies:  Allergies  Allergen Reactions   Fish Allergy  Itching   Peanut  Butter Flavoring Agent (Non-Screening) Itching   Shrimp (Diagnostic) Itching    Physical Exam: There were no vitals taken for this visit.   Physical Exam  Diagnostics:    Assessment and Plan: No diagnosis found.  No orders of the defined types were placed in this encounter.   There are no Patient Instructions on file for this visit.  No follow-ups on file.    Thank you for the opportunity to care for this patient.  Please do not hesitate to contact me with questions.  Arlean Mutter, FNP [facility]

## 2024-01-04 NOTE — Patient Instructions (Incomplete)
 Allergic rhinitis Continue allergen avoidance measures directed toward grass pollen, weed pollen, and dust mite as listed below Continue an over-the-counter antihistamine once a day if needed for allergic rhinitis control Consider saline nasal rinses as needed for nasal symptoms. Use this before any medicated nasal sprays for best result  Atopic dermatitis Continue a twice a day moisturizing routine  Food allergy  Continue to avoid shellfish and peanuts.  In case of an allergic reaction, take cetirizine 10 mg once every 24 hours, and if life-threatening symptoms occur, inject with {Blank single:19197::EpiPen  0.3 mg,EpiPen  Jr. 0.15 mg,AuviQ 0.3 mg,AuviQ 0.15 mg,AuviQ 0.10 mg}.  Call the clinic if this treatment plan is not working well for you.  Follow up in *** or sooner if needed.  Reducing Pollen Exposure The American Academy of Allergy , Asthma and Immunology suggests the following steps to reduce your exposure to pollen during allergy  seasons. Do not hang sheets or clothing out to dry; pollen may collect on these items. Do not mow lawns or spend time around freshly cut grass; mowing stirs up pollen. Keep windows closed at night.  Keep car windows closed while driving. Minimize morning activities outdoors, a time when pollen counts are usually at their highest. Stay indoors as much as possible when pollen counts or humidity is high and on windy days when pollen tends to remain in the air longer. Use air conditioning when possible.  Many air conditioners have filters that trap the pollen spores. Use a HEPA room air filter to remove pollen form the indoor air you breathe.   Control of Dust Mite Allergen Dust mites play a major role in allergic asthma and rhinitis. They occur in environments with high humidity wherever human skin is found. Dust mites absorb humidity from the atmosphere (ie, they do not drink) and feed on organic matter (including shed human and animal skin). Dust  mites are a microscopic type of insect that you cannot see with the naked eye. High levels of dust mites have been detected from mattresses, pillows, carpets, upholstered furniture, bed covers, clothes, soft toys and any woven material. The principal allergen of the dust mite is found in its feces. A gram of dust may contain 1,000 mites and 250,000 fecal particles. Mite antigen is easily measured in the air during house cleaning activities. Dust mites do not bite and do not cause harm to humans, other than by triggering allergies/asthma.  Ways to decrease your exposure to dust mites in your home:  1. Encase mattresses, box springs and pillows with a mite-impermeable barrier or cover  2. Wash sheets, blankets and drapes weekly in hot water (130 F) with detergent and dry them in a dryer on the hot setting.  3. Have the room cleaned frequently with a vacuum cleaner and a damp dust-mop. For carpeting or rugs, vacuuming with a vacuum cleaner equipped with a high-efficiency particulate air (HEPA) filter. The dust mite allergic individual should not be in a room which is being cleaned and should wait 1 hour after cleaning before going into the room.  4. Do not sleep on upholstered furniture (eg, couches).  5. If possible removing carpeting, upholstered furniture and drapery from the home is ideal. Horizontal blinds should be eliminated in the rooms where the person spends the most time (bedroom, study, television room). Washable vinyl, roller-type shades are optimal.  6. Remove all non-washable stuffed toys from the bedroom. Wash stuffed toys weekly like sheets and blankets above.  7. Reduce indoor humidity to less than 50%. Inexpensive  humidity monitors can be purchased at most hardware stores. Do not use a humidifier as can make the problem worse and are not recommended.

## 2024-01-05 ENCOUNTER — Ambulatory Visit: Admitting: Family Medicine

## 2024-03-23 ENCOUNTER — Ambulatory Visit (INDEPENDENT_AMBULATORY_CARE_PROVIDER_SITE_OTHER): Admitting: Dermatology

## 2024-03-23 ENCOUNTER — Encounter: Payer: Self-pay | Admitting: Dermatology

## 2024-03-23 VITALS — BP 115/82

## 2024-03-23 DIAGNOSIS — L308 Other specified dermatitis: Secondary | ICD-10-CM | POA: Diagnosis not present

## 2024-03-23 DIAGNOSIS — L2089 Other atopic dermatitis: Secondary | ICD-10-CM

## 2024-03-23 DIAGNOSIS — L209 Atopic dermatitis, unspecified: Secondary | ICD-10-CM

## 2024-03-23 NOTE — Progress Notes (Signed)
   Follow-Up Visit   Subjective  Melanie Peterson is a 43 y.o. female established patient who presents for FOLLOW UP on the diagnoses listed below:  Patient was last evaluated on 09/15/23.   Hand Derm: Prescribed Dupixent  but pt was not able to obtain due to insurance. She has been alternating Tacrolimus  & Betamethasone  every 2 weeks. Patient reports sxs are better but still slightly flared. Itch/Pain Score 6 out of 10.   Patient has previously tried and failed Clobetasol  ointment, mometasone, Desonide , betamethasone , Oral Prednisone , Eucrisa  and Tacrolimus .  Are you nursing, pregnant or trying to conceive? No   The following portions of the chart were reviewed this encounter and updated as appropriate: medications, allergies, medical history  Review of Systems:  No other skin or systemic complaints except as noted in HPI or Assessment and Plan.  Objective  Well appearing patient in no apparent distress; mood and affect are within normal limits.   A focused examination was performed of the following areas: B/L hands   Relevant exam findings are noted in the Assessment and Plan.              Assessment & Plan   1. Atopic / Hand Dermatitis with Active Flare - Assessment: Patient presents with active flare of hand dermatitis. Physical examination reveals erythematous scaly plaques on bilateral fingers, predominantly on dorsal aspect, with pompholyx and vesicles on palmar aspect of right hand. Patient reports using betamethasone  and cream from [place] with some improvement. Previous recommendation for Dupixent  was not implemented due to pharmacy rejection. Patient has history of failed treatment with tacrolimus .  - Plan:    Initiate Dupixent  prescription process    Submit documentation through CoverMyMeds and [employer]    Anticipate approximately 3 weeks for approval    Schedule injection training once approved    Prescribe new topical treatment    Recommend [employer] [place]  Hand Cream for regular use after handwashing  Follow up with patient once Dupixent  is approved for injection training.   No follow-ups on file.   Documentation: I have reviewed the above documentation for accuracy and completeness, and I agree with the above.  I, Shirron Maranda, CMA, am acting as scribe for [employer], DO.   Delon Lenis, DO

## 2024-03-23 NOTE — Patient Instructions (Addendum)
 Date: Tue Mar 23 2024  Hello Aydee,  Thank you for visiting today. Here is a summary of the key instructions:  - Medications:   - Continue using betamethasone  cream for hand eczema   - Start using [employer] [place] Hand Cream after every hand wash  - Treatment Areas:   - We will initiate the Dupixent  prescription process again   - Once approved, we will call you for injection training  - Follow-up:   - Wait for our call about Dupixent  approval (usually takes about 3 weeks)   - [facility] will handle the documentation and paperwork  Please reach out if you have any questions or concerns.  Warm regards,  Dr. [facility] Important Information  Due to recent changes in healthcare laws, you may see results of your pathology and/or laboratory studies on MyChart before the doctors have had a chance to review them. We understand that in some cases there may be results that are confusing or concerning to you. Please understand that not all results are received at the same time and often the doctors may need to interpret multiple results in order to provide you with the best plan of care or course of treatment. Therefore, we ask that you please give us  2 business days to thoroughly review all your results before contacting the office for clarification. Should we see a critical lab result, you will be contacted sooner.   If You Need Anything After Your Visit  If you have any questions or concerns for your doctor, please call our main line at [phone] If no one answers, please leave a voicemail as directed and we will return your call as soon as possible. Messages left after 4 pm will be answered the following business day.   You may also send us  a message via MyChart. We typically respond to MyChart messages within 1-2 business days.  For prescription refills, please ask your pharmacy to contact our office. Our fax number is [phone].  If you have an urgent issue when  the clinic is closed that cannot wait until the next business day, you can page your doctor at the number below.    Please note that while we do our best to be available for urgent issues outside of office hours, we are not available 24/7.   If you have an urgent issue and are unable to reach us , you may choose to seek medical care at your doctor's office, retail clinic, urgent care center, or emergency room.  If you have a medical emergency, please immediately call 911 or go to the emergency department. In the event of inclement weather, please call our main line at [phone] for an update on the status of any delays or closures.  Dermatology Medication Tips: Please keep the boxes that topical medications come in in order to help keep track of the instructions about where and how to use these. Pharmacies typically print the medication instructions only on the boxes and not directly on the medication tubes.   If your medication is too expensive, please contact our office at [phone] or send us  a message through MyChart.   We are unable to tell what your co-pay for medications will be in advance as this is different depending on your insurance coverage. However, we may be able to find a substitute medication at lower cost or fill out paperwork to get insurance to cover a needed medication.   If a prior authorization is required to get your medication covered  by your insurance company, please allow us  1-2 business days to complete this process.  Drug prices often vary depending on where the prescription is filled and some pharmacies may offer cheaper prices.  The website www.goodrx.com contains coupons for medications through different pharmacies. The prices here do not account for what the cost may be with help from insurance (it may be cheaper with your insurance), but the website can give you the price if you did not use any insurance.  - You can print the associated coupon and take it  with your prescription to the pharmacy.  - You may also stop by our office during regular business hours and pick up a GoodRx coupon card.  - If you need your prescription sent electronically to a different pharmacy, notify our office through [facility] or by phone at [phone]

## 2024-05-05 ENCOUNTER — Other Ambulatory Visit: Payer: Self-pay

## 2024-05-05 DIAGNOSIS — L2089 Other atopic dermatitis: Secondary | ICD-10-CM

## 2024-05-05 MED ORDER — DUPIXENT 300 MG/2ML ~~LOC~~ SOAJ
300.0000 mg | SUBCUTANEOUS | 11 refills | Status: DC
Start: 2024-05-05 — End: 2024-05-13

## 2024-05-05 MED ORDER — DUPIXENT 300 MG/2ML ~~LOC~~ SOAJ
600.0000 mg | Freq: Once | SUBCUTANEOUS | 0 refills | Status: AC
Start: 2024-05-05 — End: 2024-05-05

## 2024-05-13 ENCOUNTER — Other Ambulatory Visit: Payer: Self-pay

## 2024-05-13 DIAGNOSIS — L2089 Other atopic dermatitis: Secondary | ICD-10-CM

## 2024-05-13 MED ORDER — DUPIXENT 300 MG/2ML ~~LOC~~ SOAJ
300.0000 mg | SUBCUTANEOUS | 11 refills | Status: AC
Start: 2024-05-13 — End: ?

## 2024-05-25 ENCOUNTER — Encounter: Payer: Self-pay | Admitting: Dermatology

## 2024-05-25 ENCOUNTER — Ambulatory Visit (INDEPENDENT_AMBULATORY_CARE_PROVIDER_SITE_OTHER): Admitting: Dermatology

## 2024-05-25 VITALS — BP 117/76 | HR 76

## 2024-05-25 DIAGNOSIS — L299 Pruritus, unspecified: Secondary | ICD-10-CM

## 2024-05-25 DIAGNOSIS — L2089 Other atopic dermatitis: Secondary | ICD-10-CM

## 2024-05-25 MED ORDER — DUPILUMAB 300 MG/2ML ~~LOC~~ SOAJ
600.0000 mg | Freq: Once | SUBCUTANEOUS | Status: AC
Start: 2024-05-25 — End: 2024-05-25
  Administered 2024-05-25: 600 mg via SUBCUTANEOUS

## 2024-05-25 NOTE — Patient Instructions (Addendum)
 Injection Schedule Dates: - June 08, 2024 - June 22, 2024 - December 23,2025 - July 20, 2024 - August 03, 2024 -August 17, 2024 - August 31, 2024 - September 21, 2024  Please set a reminder in your phone to inject every 2 weeks.  Important Information   Due to recent changes in healthcare laws, you may see results of your pathology and/or laboratory studies on MyChart before the doctors have had a chance to review them. We understand that in some cases there may be results that are confusing or concerning to you. Please understand that not all results are received at the same time and often the doctors may need to interpret multiple results in order to provide you with the best plan of care or course of treatment. Therefore, we ask that you please give us  2 business days to thoroughly review all your results before contacting the office for clarification. Should we see a critical lab result, you will be contacted sooner.     If You Need Anything After Your Visit   If you have any questions or concerns for your doctor, please call our main line at [phone]. If no one answers, please leave a voicemail as directed and we will return your call as soon as possible. Messages left after 4 pm will be answered the following business day.    You may also send us  a message via MyChart. We typically respond to MyChart messages within 1-2 business days.  For prescription refills, please ask your pharmacy to contact our office. Our fax number is [phone].  If you have an urgent issue when the clinic is closed that cannot wait until the next business day, you can page your doctor at the number below.     Please note that while we do our best to be available for urgent issues outside of office hours, we are not available 24/7.    If you have an urgent issue and are unable to reach us , you may choose to seek medical care at your doctor's office, retail clinic, urgent care center, or  emergency room.   If you have a medical emergency, please immediately call 911 or go to the emergency department. In the event of inclement weather, please call our main line at [phone] for an update on the status of any delays or closures.  Dermatology Medication Tips: Please keep the boxes that topical medications come in in order to help keep track of the instructions about where and how to use these. Pharmacies typically print the medication instructions only on the boxes and not directly on the medication tubes.   If your medication is too expensive, please contact our office at [phone] or send us  a message through MyChart.    We are unable to tell what your co-pay for medications will be in advance as this is different depending on your insurance coverage. However, we may be able to find a substitute medication at lower cost or fill out paperwork to get insurance to cover a needed medication.    If a prior authorization is required to get your medication covered by your insurance company, please allow us  1-2 business days to complete this process.   Drug prices often vary depending on where the prescription is filled and some pharmacies may offer cheaper prices.   The website www.goodrx.com contains coupons for medications through different pharmacies. The prices here do not account for what the cost may be with help from insurance (it may be  cheaper with your insurance), but the website can give you the price if you did not use any insurance.  - You can print the associated coupon and take it with your prescription to the pharmacy.  - You may also stop by our office during regular business hours and pick up a GoodRx coupon card.  - If you need your prescription sent electronically to a different pharmacy, notify our office through [facility] or by phone at [phone]

## 2024-05-25 NOTE — Progress Notes (Signed)
   Follow-Up Visit   Subjective  Melanie Peterson is a 43 y.o. female who presents for the following: Dupixent  Injection Teaching for Atopic Dermatitis  Patient present today for follow up visit for Dupixent  Injection Teaching for Atopic Dermatitis. Patient was last evaluated on 03/23/24. At this visit patient was prescribed Dupixent . Patient reports sxs are unchanged. She is still applying Betamethasone  Cream alternating every 2 weeks with Tacrolimus . Today patient reports her current itch is 10 out of 10. Patient reports the current state of her Atopic Dermatitis is negatively impacting her life. Patient denies medication changes.  Patient reports she is not actively pregnant, trying to conceive or nursing.   Patient provided verbal consent for the use of an AI-assisted program to generate a detailed after-visit summary. The patient understands that the AI tool is used to support clinical documentation and that all information will be reviewed and verified by the healthcare provider.  The following portions of the chart were reviewed this encounter and updated as appropriate: medications, allergies, medical history  Review of Systems:  No other skin or systemic complaints except as noted in HPI or Assessment and Plan.  Objective  Well appearing patient in no apparent distress; mood and affect are within normal limits.  A full examination was performed including scalp, head, eyes, ears, nose, lips, neck, chest, axillae, abdomen, back, buttocks, bilateral upper extremities, bilateral lower extremities, hands, feet, fingers, toes, fingernails, and toenails. All findings within normal limits unless otherwise noted below.   Relevant exam findings are noted in the Assessment and Plan.                           Assessment & Plan   ATOPIC DERMATITIS Exam: Scaly pink papules coalescing to plaques 10% BSA IGA: 3  Flared  Chronic atopic dermatitis with persistent pruritus and xerosis.  Currently on tacrolimus  and betamethasone , but reports limited efficacy. Initiating Dupixent  injections to address pruritus from an internal perspective. Emphasized the importance of continued topical treatments and regular moisturizing to enhance skin healing and efficacy of Dupixent .   Treatment Plan: - Patient instructed how to complete Dupixent  Injections with Demonstration Pen - Patient completed Injections while in office at [facility], Mild erythema noted at injection site.   - Advised to continue topicals (Betamethasone  and Tacrolimus  for break through Eczema Flares - Patient tolerated well and demonstrated understanding - Educated that her injections should be every 2 weeks, Next Injection date is 06/08/2024 - Recommend gentle skin care. - Plan to follow up in 4 months   DUPIXENT  (office samples) NDC: 9975-4084-79 LOT: [id number] EXP: 06/13/2026 OTHER ATOPIC DERMATITIS   Related Medications betamethasone  dipropionate 0.05 % cream Apply topically 2 (two) times daily. Use for 2 weeks, then stop for 2 weeks. Repeat until clear. Dupilumab  (DUPIXENT ) 300 MG/2ML SOAJ Inject 300 mg into the skin every 14 (fourteen) days. Starting at day 15 for maintenance. Dupilumab  SOAJ 600 mg   Return in about 4 months (around 09/13/2024) for Eczema F/U.  I, Jetta Ager, am acting as neurosurgeon for [employer], DO.  Documentation: I have reviewed the above documentation for accuracy and completeness, and I agree with the above.  Delon Lenis, DO

## 2024-09-14 ENCOUNTER — Ambulatory Visit: Admitting: Dermatology
